# Patient Record
Sex: Female | Born: 1990 | Race: White | Hispanic: No | Marital: Single | Smoking: Never smoker
Health system: Southern US, Community
[De-identification: ages and names within clinical notes are randomized; demographics above are authoritative.]

## PROBLEM LIST (undated history)

## (undated) DIAGNOSIS — D649 Anemia, unspecified: Secondary | ICD-10-CM

## (undated) HISTORY — PX: TONSILLECTOMY: SUR1361

## (undated) HISTORY — PX: KIDNEY SURGERY: SHX687

---
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
## 2013-10-21 ENCOUNTER — Emergency Department (INDEPENDENT_AMBULATORY_CARE_PROVIDER_SITE_OTHER)
Admission: EM | Admit: 2013-10-21 | Discharge: 2013-10-21 | Disposition: A | Discharge status: Home / Self Care | Payer: Medicaid Other | Source: Home / Self Care | Attending: Emergency Medicine | Admitting: Emergency Medicine

## 2013-10-21 ENCOUNTER — Encounter (HOSPITAL_COMMUNITY): Payer: Self-pay | Admitting: Emergency Medicine

## 2013-10-21 ENCOUNTER — Emergency Department (HOSPITAL_COMMUNITY)
Admission: EM | Admit: 2013-10-21 | Discharge: 2013-10-21 | Discharge status: Absent without leave | Payer: Medicaid Other | Source: Home / Self Care

## 2013-10-21 ENCOUNTER — Emergency Department (INDEPENDENT_AMBULATORY_CARE_PROVIDER_SITE_OTHER): Payer: Medicaid Other

## 2013-10-21 DIAGNOSIS — J209 Acute bronchitis, unspecified: Secondary | ICD-10-CM

## 2013-10-21 HISTORY — DX: Anemia, unspecified: D64.9

## 2013-10-21 MED ORDER — TRAMADOL HCL 50 MG PO TABS: ORAL_TABLET | ORAL | Status: DC

## 2013-10-21 MED ORDER — ALBUTEROL SULFATE HFA 108 (90 BASE) MCG/ACT IN AERS: 2.0000 | INHALATION_SPRAY | Freq: Four times a day (QID) | RESPIRATORY_TRACT | Status: AC

## 2013-10-21 MED ORDER — AZITHROMYCIN 250 MG PO TABS: ORAL_TABLET | ORAL | Status: DC

## 2013-10-21 NOTE — ED Notes (Signed)
Pt c/o dry cough, congestion, HAs, bodyaches and chills x 2-3 days. Pt reports she is anemic but still feels very weak. Took 800 mg of Ibuprofen with no relief. Pt denies fever, n/v/d. Pt is alert and oriented and in no acute distress.

## 2013-10-21 NOTE — Discharge Instructions (Signed)
How to Use an Inhaler °Proper inhaler technique is very important. Good technique ensures that the medicine reaches the lungs. Poor technique results in depositing the medicine on the tongue and back of the throat rather than in the airways. If you do not use the inhaler with good technique, the medicine will not help you. °STEPS TO FOLLOW IF USING AN INHALER WITHOUT AN EXTENSION TUBE °1. Remove the cap from the inhaler. °2. If you are using the inhaler for the first time, you will need to prime it. Shake the inhaler for 5 seconds and release four puffs into the air, away from your face. Ask your health care provider or pharmacist if you have questions about priming your inhaler. °3. Shake the inhaler for 5 seconds before each breath in (inhalation). °4. Position the inhaler so that the top of the canister faces up. °5. Put your index finger on the top of the medicine canister. Your thumb supports the bottom of the inhaler. °6. Open your mouth. °7. Either place the inhaler between your teeth and place your lips tightly around the mouthpiece, or hold the inhaler 1 2 inches away from your open mouth. If you are unsure of which technique to use, ask your health care provider. °8. Breathe out (exhale) normally and as completely as possible. °9. Press the canister down with your index finger to release the medicine. °10. At the same time as the canister is pressed, inhale deeply and slowly until your lungs are completely filled. This should take 4 6 seconds. Keep your tongue down. °11. Hold the medicine in your lungs for 5 10 seconds (10 seconds is best). This helps the medicine get into the small airways of your lungs. °12. Breathe out slowly, through pursed lips. Whistling is an example of pursed lips. °13. Wait at least 15 30 seconds between puffs. Continue with the above steps until you have taken the number of puffs your health care provider has ordered. Do not use the inhaler more than your health care provider  tells you. °14. Replace the cap on the inhaler. °15. Follow the directions from your health care provider or the inhaler insert for cleaning the inhaler. °STEPS TO FOLLOW IF USING AN INHALER WITH AN EXTENSION (SPACER) °1. Remove the cap from the inhaler. °2. If you are using the inhaler for the first time, you will need to prime it. Shake the inhaler for 5 seconds and release four puffs into the air, away from your face. Ask your health care provider or pharmacist if you have questions about priming your inhaler. °3. Shake the inhaler for 5 seconds before each breath in (inhalation). °4. Place the open end of the spacer onto the mouthpiece of the inhaler. °5. Position the inhaler so that the top of the canister faces up and the spacer mouthpiece faces you. °6. Put your index finger on the top of the medicine canister. Your thumb supports the bottom of the inhaler and the spacer. °7. Breathe out (exhale) normally and as completely as possible. °8. Immediately after exhaling, place the spacer between your teeth and into your mouth. Close your lips tightly around the spacer. °9. Press the canister down with your index finger to release the medicine. °10. At the same time as the canister is pressed, inhale deeply and slowly until your lungs are completely filled. This should take 4 6 seconds. Keep your tongue down and out of the way. °11. Hold the medicine in your lungs for 5 10 seconds (10   seconds is best). This helps the medicine get into the small airways of your lungs. Exhale. °12. Repeat inhaling deeply through the spacer mouthpiece. Again hold that breath for up to 10 seconds (10 seconds is best). Exhale slowly. If it is difficult to take this second deep breath through the spacer, breathe normally several times through the spacer. Remove the spacer from your mouth. °13. Wait at least 15 30 seconds between puffs. Continue with the above steps until you have taken the number of puffs your health care provider has  ordered. Do not use the inhaler more than your health care provider tells you. °14. Remove the spacer from the inhaler, and place the cap on the inhaler. °15. Follow the directions from your health care provider or the inhaler insert for cleaning the inhaler and spacer. °If you are using different kinds of inhalers, use your quick relief medicine to open the airways 10 15 minutes before using a steroid if instructed to do so by your health care provider. If you are unsure which inhalers to use and the order of using them, ask your health care provider, nurse, or respiratory therapist. °If you are using a steroid inhaler, always rinse your mouth with water after your last puff, then gargle and spit out the water. Do not swallow the water. °AVOID: °· Inhaling before or after starting the spray of medicine. It takes practice to coordinate your breathing with triggering the spray. °· Inhaling through the nose (rather than the mouth) when triggering the spray. °HOW TO DETERMINE IF YOUR INHALER IS FULL OR NEARLY EMPTY °You cannot know when an inhaler is empty by shaking it. A few inhalers are now being made with dose counters. Ask your health care provider for a prescription that has a dose counter if you feel you need that extra help. If your inhaler does not have a counter, ask your health care provider to help you determine the date you need to refill your inhaler. Write the refill date on a calendar or your inhaler canister. Refill your inhaler 7 10 days before it runs out. Be sure to keep an adequate supply of medicine. This includes making sure it is not expired, and that you have a spare inhaler.  °SEEK MEDICAL CARE IF:  °· Your symptoms are only partially relieved with your inhaler. °· You are having trouble using your inhaler. °· You have some increase in phlegm. °SEEK IMMEDIATE MEDICAL CARE IF:  °· You feel little or no relief with your inhalers. You are still wheezing and are feeling shortness of breath or  tightness in your chest or both. °· You have dizziness, headaches, or a fast heart rate. °· You have chills, fever, or night sweats. °· You have a noticeable increase in phlegm production, or there is blood in the phlegm. °MAKE SURE YOU:  °· Understand these instructions. °· Will watch your condition. °· Will get help right away if you are not doing well or get worse. °Document Released: 07/02/2000 Document Revised: 04/25/2013 Document Reviewed: 02/01/2013 °[employer]® Patient Information ©2014 [employer]. ° °Most upper respiratory infections are caused by viruses and do not require antibiotics.  We try to save the antibiotics for when we really need them to prevent bacteria from developing resistance to them.  Here are a few hints about things that can be done at home to help get over an upper respiratory infection quicker: ° °Get extra sleep and extra fluids.  Get 7 to 9 hours of sleep per   night and 6 to 8 glasses of water a day.  Getting extra sleep keeps the immune system from getting run down.  Most people with an upper respiratory infection are a little dehydrated.  The extra fluids also keep the secretions liquified and easier to deal with.  Also, get extra vitamin C.  4000 mg per day is the recommended dose. °For the aches, headache, and fever, acetaminophen or ibuprofen are helpful.  These can be alternated every 4 hours.  People with liver disease should avoid large amounts of acetaminophen, and people with ulcer disease, gastroesophageal reflux, gastritis, congestive heart failure, chronic kidney disease, coronary artery disease and the elderly should avoid ibuprofen. °For nasal congestion try Mucinex-D, or if you're having lots of sneezing or clear nasal drainage use Zyrtec-D. People with high blood pressure can take these if their blood pressure is controlled, if not, it's best to avoid the forms with a "D" (decongestants).  You can use the plain Mucinex, Allegra, Claritin, or Zyrtec even if your blood  pressure is not controlled.   °A Saline nasal spray such as Ocean Spray can also help.  You can add a decongestant sprays such as Afrin, but you should not use the decongestant sprays for more than 3 or 4 days since they can be habituating.  Breathe Rite nasal strips can also offer a non-drug alternative treatment to nasal congestion, especially at night. °For people with symptoms of sinusitis, sleeping with your head elevated can be helpful.  For sinus pain, moist, hot compresses to the face may provide some relief.  Many people find that inhaling steam as in a shower or from a pot of steaming water can help. °For any viral infection, zinc containing lozenges such as Cold-Eze or Zicam are helpful.  Zinc helps to fight viral infection.  Hot salt water gargles (8 oz of hot water, 1/2 tsp of table salt, and a pinch of baking soda) can give relief as well as hot beverages such as hot tea.  Sucrets extra strength lozenges will help the sore throat.  °For the cough, take Delsym 2 tsp every 12 hours.  It has also been found recently that Aleve can help control a cough.  The dose is 1 to 2 tablets twice daily with food.  This can be combined with Delsym. (Note, if you are taking ibuprofen, you should not take Aleve as well--take one or the other.) °A cool mist vaporizer will help keep your mucous membranes from drying out.  ° °It's important when you have an upper respiratory infection not to pass the infection to others.  This involves being very careful about the following: ° °Frequent hand washing or use of hand sanitizer, especially after coughing, sneezing, blowing your nose or touching your face, nose or eyes. °Do not shake hands or touch anyone and try to avoid touching surfaces that other people use such as doorknobs, shopping carts, telephones and computer keyboards. °Use tissues and dispose of them properly in a garbage can or ziplock bag. °Cough into your sleeve. °Do not let others eat or drink after  you. ° °It's also important to recognize the signs of serious illness and get evaluated if they occur: °Any respiratory infection that lasts more than 7 to 10 days.  Yellow nasal drainage and sputum are not reliable indicators of a bacterial infection, but if they last for more than 1 week, see your doctor. °Fever and sore throat can indicate strep. °Fever and cough can indicate influenza or pneumonia. °  Any kind of severe symptom such as difficulty breathing, intractable vomiting, or severe pain should prompt you to see a doctor as soon as possible. ° ° °Your body's immune system is really the thing that will get rid of this infection.  Your immune system is comprised of 2 types of specialized cells called T cells and B cells.  T cells coordinate the array of cells in your body that engulf invading bacteria or viruses while B cells orchestrate the production of antibodies that neutralize infection.  Anything we do or any medications we give you, will just strengthen your immune system or help it clear up the infection quicker.  Here are a few helpful hints to improve your immune system to help overcome this illness or to prevent future infections: °· A few vitamins can improve the health of your immune system.  That's why your diet should include plenty of fruits, vegetables, fish, nuts, and whole grains. °· Vitamin A and bet-carotene can increase the cells that fight infections (T cells and B cells).  Vitamin A is abundant in dark greens and orange vegetables such as spinach, greens, sweet potatoes, and carrots. °· Vitamin B6 contributes to the maturation of white blood cells, the cells that fight disease.  Foods with vitamin B6 include cold cereal and bananas. °· Vitamin C is credited with preventing colds because it increases white blood cells and also prevents cellular damage.  Citrus fruits, peaches and green and red bell peppers are all hight in vitamin C. °· Vitamin E is an anti-oxidant that encourages the  production of natural killer cells which reject foreign invaders and B cells that produce antibodies.  Foods high in vitamin E include wheat germ, nuts and seeds. °· Foods high in omega-3 fatty acids found in foods like salmon, tuna and mackerel boost your immune system and help cells to engulf and absorb germs. °· Probiotics are good bacteria that increase your T cells.  These can be found in yogurt and are available in supplements such as Culturelle or Align. °· Moderate exercise increases the strength of your immune system and your ability to recover from illness.  I suggest 3 to 5 moderate intensity 30 minute workouts per week.   °· Sleep is another component of maintaining a strong immune system.  It enables your body to recuperate from the day's activities, stress and work.  My recommendation is to get between 7 and 9 hours of sleep per night. °· If you smoke, try to quit completely or at least cut down.  Drink alcohol only in moderation if at all.  No more than 2 drinks daily for men or 1 for women. °· Get a flu vaccine early in the fall or if you have not gotten one yet, once this illness has run its course.  If you are over 65, a smoker, or an asthmatic, get a pneumococcal vaccine. °· My final recommendation is to maintain a healthy weight.  Excess weight can impair the immune system by interfering with the way the immune system deals with invading viruses or bacteria. ° ° ° °

## 2013-10-21 NOTE — ED Provider Notes (Signed)
Chief Complaint   Chief Complaint  Patient presents with  . URI    History of Present Illness   Barbara Hobbs is a 23 year old female who comes in today with 2 sick children. She has had a three-day history of generalized myalgias, chills, felt hot and cold, she's had a cough productive green sputum, chest tightness, aching in her chest. She also has had headache, nasal congestion, ear ache, and sore throat.  Review of Systems   Other than as noted above, the patient denies any of the following symptoms: Systemic:  No fevers, chills, sweats, or myalgias. Eye:  No redness or discharge. ENT:  No ear pain, headache, nasal congestion, drainage, sinus pressure, or sore throat. Neck:  No neck pain, stiffness, or swollen glands. Lungs:  No cough, sputum production, hemoptysis, wheezing, chest tightness, shortness of breath or chest pain. GI:  No abdominal pain, nausea, vomiting or diarrhea.  PMFSH   Past medical history, family history, social history, meds, and allergies were reviewed.   Physical exam   Vital signs:  BP 104/62  Pulse 87  Temp(Src) 100.1 F (37.8 C) (Oral)  Resp 18  SpO2 100%  LMP 09/27/2013 General:  Alert and oriented.  In no distress.  Skin warm and dry. Eye:  No conjunctival injection or drainage. Lids were normal. ENT:  TMs and canals were normal, without erythema or inflammation.  Nasal mucosa was clear and uncongested, without drainage.  Mucous membranes were moist.  Pharynx was clear with no exudate or drainage.  There were no oral ulcerations or lesions. Neck:  Supple, no adenopathy, tenderness or mass. Lungs:  No respiratory distress.  There were rales and wheezes at the left base, no rhonchi. She has good air movement bilaterally. Heart:  Regular rhythm, without gallops, murmers or rubs. Skin:  Clear, warm, and dry, without rash or lesions.  Radiology   Dg Chest 2 View  10/21/2013   CLINICAL DATA:  Cough, fever  EXAM: CHEST  2 VIEW  COMPARISON:   None.  FINDINGS: Cardiomediastinal silhouette is unremarkable. No acute infiltrate or pleural effusion. No pulmonary edema. Bony thorax is unremarkable.  IMPRESSION: No active cardiopulmonary disease.   Electronically Signed   By: Natasha Mead M.D.   On: 10/21/2013 18:32   Assessment     The encounter diagnosis was Acute bronchitis.  Plan    1.  Meds:  The following meds were prescribed:   Discharge Medication List as of 10/21/2013  6:46 PM    START taking these medications   Details  albuterol (PROVENTIL HFA;VENTOLIN HFA) 108 (90 BASE) MCG/ACT inhaler Inhale 2 puffs into the lungs 4 (four) times daily., Starting 10/21/2013, Until Discontinued, Normal    azithromycin (ZITHROMAX Z-PAK) 250 MG tablet Take as directed., Normal    traMADol (ULTRAM) 50 MG tablet 1 to 2 tabs every 8 hours as needed for cough., Normal        2.  Patient Education/Counseling:  The patient was given appropriate handouts, self care instructions, and instructed in symptomatic relief.  Instructed to get extra fluids, rest, and use a cool mist vaporizer.    3.  Follow up:  The patient was told to follow up here if no better in 3 to 4 days, or sooner if becoming worse in any way, and given some red flag symptoms such as increasing fever, difficulty breathing, chest pain, or persistent vomiting which would prompt immediate return.  Follow up here as needed.      Reuben Likes,  MD 10/21/13 2039

## 2013-10-24 NOTE — ED Notes (Signed)
Pt  Phoned  Stating that  She  Thinks  The   z  Max  Is  Making he         Sick  She  States  She  Vomited        After  Taking  It     Dr Lorenz Coasterkeller notified  [facility]  500  Mg   Tid  Number  30  To  Be  Phoned  In to  [facility]  On [place] /  Francesco RunnerHolden

## 2017-12-05 ENCOUNTER — Emergency Department (HOSPITAL_COMMUNITY): Payer: Self-pay

## 2017-12-05 ENCOUNTER — Emergency Department (HOSPITAL_COMMUNITY)
Admission: EM | Admit: 2017-12-05 | Discharge: 2017-12-05 | Disposition: A | Discharge status: Home / Self Care | Payer: Self-pay | Attending: Emergency Medicine | Admitting: Emergency Medicine

## 2017-12-05 ENCOUNTER — Encounter (HOSPITAL_COMMUNITY): Payer: Self-pay | Admitting: Emergency Medicine

## 2017-12-05 ENCOUNTER — Encounter (HOSPITAL_COMMUNITY): Payer: Self-pay

## 2017-12-05 ENCOUNTER — Other Ambulatory Visit: Payer: Self-pay

## 2017-12-05 DIAGNOSIS — R072 Precordial pain: Secondary | ICD-10-CM | POA: Insufficient documentation

## 2017-12-05 LAB — BASIC METABOLIC PANEL
ANION GAP: 10 (ref 5–15)
BUN: 13 mg/dL (ref 6–20)
CHLORIDE: 106 mmol/L (ref 101–111)
CO2: 22 mmol/L (ref 22–32)
Calcium: 8.5 mg/dL — ABNORMAL LOW (ref 8.9–10.3)
Creatinine, Ser: 0.7 mg/dL (ref 0.44–1.00)
GFR calc non Af Amer: >60 mL/min (ref >60)
Glucose, Bld: 116 mg/dL — ABNORMAL HIGH (ref 65–99)
POTASSIUM: 3.1 mmol/L — AB (ref 3.5–5.1)
SODIUM: 138 mmol/L (ref 135–145)

## 2017-12-05 LAB — I-STAT TROPONIN, ED: Troponin i, poc: 0 ng/mL (ref 0.00–0.08)

## 2017-12-05 LAB — CBC
HEMATOCRIT: 39.6 % (ref 36.0–46.0)
Hemoglobin: 13.5 g/dL (ref 12.0–15.0)
MCH: 31.3 pg (ref 26.0–34.0)
MCHC: 34.1 g/dL (ref 30.0–36.0)
MCV: 91.7 fL (ref 78.0–100.0)
Platelets: 186 10*3/uL (ref 150–400)
RBC: 4.32 MIL/uL (ref 3.87–5.11)
RDW: 11.8 % (ref 11.5–15.5)
WBC: 3.8 10*3/uL — AB (ref 4.0–10.5)

## 2017-12-05 LAB — I-STAT BETA HCG BLOOD, ED (MC, WL, AP ONLY): I-stat hCG, quantitative: <5 m[IU]/mL (ref <5)

## 2017-12-05 LAB — D-DIMER, QUANTITATIVE (NOT AT ARMC)

## 2017-12-05 MED ORDER — IBUPROFEN 800 MG PO TABS
800.0000 mg | ORAL_TABLET | Freq: Once | ORAL | Status: AC
  Administered 2017-12-05: 800 mg via ORAL
  Filled 2017-12-05: qty 1

## 2017-12-05 MED ORDER — IBUPROFEN 800 MG PO TABS: 800.0000 mg | ORAL_TABLET | Freq: Three times a day (TID) | ORAL | 0 refills | Status: DC | PRN

## 2017-12-05 NOTE — ED Triage Notes (Signed)
Pt states that L sided CP has been going on for a couple weeks, worse tonight along with SOB, denies n/v, worse with palpation and movement. Reports heavy lifting two weeks ago

## 2017-12-05 NOTE — ED Provider Notes (Signed)
Emergency Department Provider Note   I have reviewed the triage vital signs and the nursing notes.   HISTORY  Chief Complaint Chest Pain   HPI Barbara Hobbs is a 27 y.o. female with PMH of OCP use presents to the emergency department for evaluation of intermittent chest pain over the past 3 weeks.  Patient states that initially her pain felt like a "pulling" in the left chest and started after lifting.  Her pain has become more sharp, left-sided, nonradiating, and worse with deep breathing over the past 36 hours which the patient concerned.  She denies any dyspnea or near syncope.  No hemoptysis.  No history of chest wall injury.  No prior history of PE/DVT.  Denies any leg pain or swelling.   Past Medical History:  Diagnosis Date  . Anemia     There are no active problems to display for this patient.   History reviewed. No pertinent surgical history.    Allergies Penicillins  No family history on file.  Social History Social History   Tobacco Use  . Smoking status: Never Smoker  . Smokeless tobacco: Never Used  Substance Use Topics  . Alcohol use: Never    Frequency: Never  . Drug use: Never    Review of Systems  Constitutional: No fever/chills Eyes: No visual changes. ENT: No sore throat. Cardiovascular: Positive chest pain. Respiratory: Denies shortness of breath. Gastrointestinal: No abdominal pain.  No nausea, no vomiting.  No diarrhea.  No constipation. Genitourinary: Negative for dysuria. Musculoskeletal: Negative for back pain. Skin: Negative for rash. Neurological: Negative for headaches, focal weakness or numbness.  10-point ROS otherwise negative.  ____________________________________________   PHYSICAL EXAM:  VITAL SIGNS: ED Triage Vitals [12/05/17 0100]  Enc Vitals Group     BP 117/70     Pulse Rate 71     Resp 20     Temp 97.8 F (36.6 C)     Temp Source Oral     SpO2 100 %     Pain Score 6   Constitutional: Alert and  oriented. Well appearing and in no acute distress. Eyes: Conjunctivae are normal. Head: Atraumatic. Nose: No congestion/rhinnorhea. Mouth/Throat: Mucous membranes are moist.  Oropharynx non-erythematous. Neck: No stridor.   Cardiovascular: Normal rate, regular rhythm. Good peripheral circulation. Grossly normal heart sounds.   Respiratory: Normal respiratory effort.  No retractions. Lungs CTAB. Gastrointestinal: Soft and nontender. No distention.  Musculoskeletal: No lower extremity tenderness nor edema. No gross deformities of extremities. Neurologic:  Normal speech and language. No gross focal neurologic deficits are appreciated.  Skin:  Skin is warm, dry and intact. No rash noted.  ____________________________________________   LABS (all labs ordered are listed, but only abnormal results are displayed)  Labs Reviewed  BASIC METABOLIC PANEL - Abnormal; Notable for the following components:      Result Value   Potassium 3.1 (*)    Glucose, Bld 116 (*)    Calcium 8.5 (*)    All other components within normal limits  CBC - Abnormal; Notable for the following components:   WBC 3.8 (*)    All other components within normal limits  D-DIMER, QUANTITATIVE (NOT AT [facility])  I-STAT TROPONIN, ED  I-STAT BETA HCG BLOOD, ED ([facility], [facility], [facility] ONLY)   ____________________________________________  EKG   EKG Interpretation  Date/Time:  Monday Dec 05 2017 00:54:20 EDT Ventricular Rate:  72 PR Interval:  126 QRS Duration: 96 QT Interval:  398 QTC Calculation: 435 R Axis:   110  Text Interpretation:  Normal sinus rhythm Right axis deviation Incomplete right bundle branch block Anterior infarct , age undetermined Abnormal ECG No previous ECGs available Confirmed by Zadie Rhine ([id number]) on 12/05/2017 1:02:21 AM Also confirmed by Zadie Rhine ([id number]), editor Elita Quick ([id number])  on 12/05/2017 6:56:23 AM       ____________________________________________  RADIOLOGY  Dg Chest 2  View  Result Date: 12/05/2017 CLINICAL DATA:  Left-sided chest pain for 2 weeks EXAM: CHEST - 2 VIEW COMPARISON:  None. FINDINGS: The heart size and mediastinal contours are within normal limits. Both lungs are clear. The visualized skeletal structures are unremarkable. IMPRESSION: No active cardiopulmonary disease. Electronically Signed   By: Jasmine Pang M.D.   On: 12/05/2017 02:27    ____________________________________________   PROCEDURES  Procedure(s) performed:   Procedures  None ____________________________________________   INITIAL IMPRESSION / ASSESSMENT AND PLAN / ED COURSE  Pertinent labs & imaging results that were available during my care of the patient were reviewed by me and considered in my medical decision making (see chart for details).  Patient presents to the emergency department with 3 weeks of intermittent chest pain.  Pain quality is changed in the last 36 hours and is now more sharp and pleuritic.  Patient has no tachycardia or hypoxemia.  My suspicion for PE remains low but patient does have elevated risk of being on OCPs.  I have added on d-dimer.  Labs reviewed from triage including troponin which are normal.  Pregnancy test negative.  Chest x-ray negative.   Differential includes all life-threatening causes for chest pain. This includes but is not exclusive to acute coronary syndrome, aortic dissection, pulmonary embolism, cardiac tamponade, community-acquired pneumonia, pericarditis, musculoskeletal chest wall pain, etc.  Labs and imaging reviewed. D-dimer negative. Plan to treat supportively at home and advised continued PCP follow up.   At this time, I do not feel there is any life-threatening condition present. I have reviewed and discussed all results (EKG, imaging, lab, urine as appropriate), exam findings with patient. I have reviewed nursing notes and appropriate previous records.  I feel the patient is safe to be discharged home without further  emergent workup. Discussed usual and customary return precautions. Patient and family (if present) verbalize understanding and are comfortable with this plan.  Patient will follow-up with their primary care provider. If they do not have a primary care provider, information for follow-up has been provided to them. All questions have been answered.  ____________________________________________  FINAL CLINICAL IMPRESSION(S) / ED DIAGNOSES  Final diagnoses:  Precordial chest pain     MEDICATIONS GIVEN DURING THIS VISIT:  Medications  ibuprofen (ADVIL,MOTRIN) tablet 800 mg (800 mg Oral Given 12/05/17 0937)     NEW OUTPATIENT MEDICATIONS STARTED DURING THIS VISIT:  Discharge Medication List as of 12/05/2017 10:53 AM    START taking these medications   Details  ibuprofen (ADVIL,MOTRIN) 800 MG tablet Take 1 tablet (800 mg total) by mouth every 8 (eight) hours as needed., Starting Mon 12/05/2017, Print        Note:  This document was prepared using Dragon voice recognition software and may include unintentional dictation errors.  Alona Bene, MD Emergency Medicine    Long, Arlyss Repress, MD 12/05/17 [place]

## 2017-12-05 NOTE — Discharge Instructions (Signed)
You were seen in the ED today with chest pain. Your labs and imaging are normal. I think this is most likely a muscle strain in the chest but if you experience sudden worsening symptoms I would want you to return to the ED for further evaluation.

## 2017-12-05 NOTE — ED Notes (Signed)
D/c reviewed with patient and family 

## 2017-12-05 NOTE — ED Notes (Signed)
One attempt to draw blood.

## 2017-12-05 NOTE — ED Notes (Signed)
Patient updated on wait time, this RN apologized for patient's wait, pt verbalized understanding, ambulatory in waiting room, nad.

## 2018-01-12 ENCOUNTER — Other Ambulatory Visit: Payer: Self-pay

## 2018-01-13 ENCOUNTER — Encounter (HOSPITAL_COMMUNITY): Payer: Self-pay | Admitting: Emergency Medicine

## 2018-01-13 ENCOUNTER — Emergency Department (HOSPITAL_COMMUNITY): Payer: Self-pay

## 2018-01-13 ENCOUNTER — Emergency Department (HOSPITAL_COMMUNITY)
Admission: EM | Admit: 2018-01-13 | Discharge: 2018-01-13 | Disposition: A | Discharge status: Home / Self Care | Payer: Self-pay | Attending: Emergency Medicine | Admitting: Emergency Medicine

## 2018-01-13 DIAGNOSIS — R1310 Dysphagia, unspecified: Secondary | ICD-10-CM | POA: Insufficient documentation

## 2018-01-13 DIAGNOSIS — R0789 Other chest pain: Secondary | ICD-10-CM | POA: Insufficient documentation

## 2018-01-13 LAB — I-STAT BETA HCG BLOOD, ED (MC, WL, AP ONLY)

## 2018-01-13 LAB — I-STAT TROPONIN, ED: Troponin i, poc: 0 ng/mL (ref 0.00–0.08)

## 2018-01-13 LAB — CBC
HEMATOCRIT: 45.8 % (ref 36.0–46.0)
HEMOGLOBIN: 14.9 g/dL (ref 12.0–15.0)
MCH: 31.1 pg (ref 26.0–34.0)
MCHC: 32.5 g/dL (ref 30.0–36.0)
MCV: 95.6 fL (ref 78.0–100.0)
Platelets: 273 10*3/uL (ref 150–400)
RBC: 4.79 MIL/uL (ref 3.87–5.11)
RDW: 12 % (ref 11.5–15.5)
WBC: 5.4 10*3/uL (ref 4.0–10.5)

## 2018-01-13 MED ORDER — CYCLOBENZAPRINE HCL 10 MG PO TABS: 10.0000 mg | ORAL_TABLET | Freq: Two times a day (BID) | ORAL | 0 refills | Status: DC | PRN

## 2018-01-13 MED ORDER — IOPAMIDOL (ISOVUE-370) INJECTION 76%
100.0000 mL | Freq: Once | INTRAVENOUS | Status: AC | PRN
  Administered 2018-01-13: 100 mL via INTRAVENOUS

## 2018-01-13 MED ORDER — IOPAMIDOL (ISOVUE-370) INJECTION 76%
INTRAVENOUS | Status: AC
  Filled 2018-01-13: qty 100

## 2018-01-13 NOTE — ED Notes (Signed)
Patient transported to CT 

## 2018-01-13 NOTE — ED Provider Notes (Signed)
[facility]   CSN: [id number] Arrival date & time: 01/13/18  1009     History   Chief Complaint Chief Complaint  Patient presents with  . Chest Pain    HPI Barbara Hobbs is a 27 y.o. female.  Pt presents to the ED today with left sided cp.  The pt has had this pain for several weeks.  She initially came to the ED on 5/20 for the same.  Eval was negative, so she was sent home.  The pt's pain does not happen all the time, it comes and goes.  She said a ball forms in her left chest and spasms.  She is worried she has a blood clot, although her ddimer on 5/20 was negative and sx sound msk in origin.  No leg pain.  She does take BCPs.  She also c/o sore throat and swollen lymph nodes.  She said she will occ have to push her lymph nodes to the side to swallow.  No f/c.  This has been going on for months.     Past Medical History:  Diagnosis Date  . Anemia     There are no active problems to display for this patient.   History reviewed. No pertinent surgical history.   OB History   None      Home Medications    Prior to Admission medications   Medication Sig Start Date End Date Taking? Authorizing Provider  JUNEL FE 1/20 1-20 MG-MCG tablet Take 1 tablet by mouth daily. 09/23/17  Yes [provider]  Multiple Vitamin (MULTIVITAMIN) capsule Take 1 capsule by mouth daily. Gummies   Yes [provider]  albuterol (PROVENTIL HFA;VENTOLIN HFA) 108 (90 BASE) MCG/ACT inhaler Inhale 2 puffs into the lungs 4 (four) times daily. Patient not taking: Reported on 01/13/2018 10/21/13   Reuben LikesKeller, David C, MD  azithromycin (ZITHROMAX Z-PAK) 250 MG tablet Take as directed. Patient not taking: Reported on 01/13/2018 10/21/13   Reuben LikesKeller, David C, MD  cyclobenzaprine (FLEXERIL) 10 MG tablet Take 1 tablet (10 mg total) by mouth 2 (two) times daily as needed for muscle spasms. 01/13/18   Jacalyn LefevreHaviland, Luticia Tadros, MD  ibuprofen (ADVIL,MOTRIN) 800  MG tablet Take 1 tablet (800 mg total) by mouth every 8 (eight) hours as needed. Patient not taking: Reported on 01/13/2018 12/05/17   Long, Arlyss RepressJoshua G, MD  traMADol (ULTRAM) 50 MG tablet 1 to 2 tabs every 8 hours as needed for cough. Patient not taking: Reported on 01/13/2018 10/21/13   Reuben LikesKeller, David C, MD    Family History History reviewed. No pertinent family history.  Social History Social History   Tobacco Use  . Smoking status: Never Smoker  . Smokeless tobacco: Never Used  Substance Use Topics  . Alcohol use: Never    Frequency: Never  . Drug use: Never     Allergies   Penicillins   Review of Systems Review of Systems  HENT: Positive for trouble swallowing.   Cardiovascular: Positive for chest pain.  All other systems reviewed and are negative.    Physical Exam Updated Vital Signs BP 114/74   Pulse 68   Temp (!) 97.4 F (36.3 C) (Oral)   Resp 13   SpO2 100%   Physical Exam  Constitutional: She is oriented to person, place, and time. She appears well-developed and well-nourished.  HENT:  Head: Normocephalic and atraumatic.  Eyes: Pupils are equal, round, and reactive to light. EOM are normal.  Neck: Normal  range of motion. Neck supple.  Cardiovascular: Normal rate, regular rhythm and normal pulses.  Pulmonary/Chest: Effort normal and breath sounds normal.    Musculoskeletal: Normal range of motion.       Right lower leg: Normal.       Left lower leg: Normal.  Lymphadenopathy:    She has no cervical adenopathy.  Neurological: She is alert and oriented to person, place, and time.  Skin: Skin is warm. Capillary refill takes less than 2 seconds.  Psychiatric: She has a normal mood and affect. Her behavior is normal.  Nursing note and vitals reviewed.    ED Treatments / Results  Labs (all labs ordered are listed, but only abnormal results are displayed) Labs Reviewed  CBC  I-STAT TROPONIN, ED  I-STAT BETA HCG BLOOD, ED ([facility], [facility], [facility] ONLY)    EKG EKG  Interpretation  Date/Time:  Thursday January 12 2018 10:22:57 EDT Ventricular Rate:  62 PR Interval:  122 QRS Duration: 96 QT Interval:  392 QTC Calculation: 397 R Axis:   91 Text Interpretation:  Normal sinus rhythm Rightward axis Incomplete right bundle branch block Borderline ECG No significant change since last tracing Confirmed by Jacalyn Lefevre [phone]) on 01/13/2018 12:08:44 PM   Radiology Ct Soft Tissue Neck W Contrast  Result Date: 01/13/2018 CLINICAL DATA:  Initial evaluation for swollen glands, sore throat for 1 month. EXAM: CT NECK WITH CONTRAST TECHNIQUE: Multidetector CT imaging of the neck was performed using the standard protocol following the bolus administration of intravenous contrast. CONTRAST:  ISOVUE-370 IOPAMIDOL (ISOVUE-370) INJECTION 76% COMPARISON:  None available. FINDINGS: Pharynx and larynx: Oral cavity within normal limits without mass lesion or loculated fluid collection. No acute abnormality about the dentition. No base of tongue lesion. Oropharynx within normal limits. Mild asymmetric fullness at the right nasopharynx without discrete mass. Parapharyngeal fat maintained. No retropharyngeal collection. Epiglottis normal. Vallecula clear. Remainder of the hypopharynx and supraglottic larynx within normal limits. True cords symmetric and normal. Subglottic airway clear. Salivary glands: Salivary glands including the parotid and submandibular glands are within normal limits. Thyroid: Thyroid normal. Lymph nodes: No pathologically enlarged lymph nodes identified within the neck. Vascular: Normal intravascular enhancement seen throughout the neck. Limited intracranial: Unremarkable. Visualized orbits: Globes and orbital soft tissues within normal limits. Mastoids and visualized paranasal sinuses: Visualized paranasal sinuses are clear. Mastoid air cells and middle ear cavities are well pneumatized and free of fluid. Skeleton: No acute osseous abnormality. No discrete lytic  or blastic osseous lesions. Upper chest: Visualized upper chest within normal limits. Partially visualized lungs are clear. Other: None. IMPRESSION: Normal CT of the neck. No acute inflammatory changes or other abnormality identified. Electronically Signed   By: Rise Mu M.D.   On: 01/13/2018 13:51   Ct Angio Chest Pe W/cm &/or Wo Cm  Result Date: 01/13/2018 CLINICAL DATA:  Left-sided chest pain for 1 month EXAM: CT ANGIOGRAPHY CHEST WITH CONTRAST TECHNIQUE: Multidetector CT imaging of the chest was performed using the standard protocol during bolus administration of intravenous contrast. Multiplanar CT image reconstructions and MIPs were obtained to evaluate the vascular anatomy. CONTRAST:  ISOVUE-370 IOPAMIDOL (ISOVUE-370) INJECTION 76% COMPARISON:  None. FINDINGS: Cardiovascular: Thoracic aorta is incompletely opacified although within normal limits without atherosclerotic change or aneurysmal dilatation. No cardiac enlargement is seen. The pulmonary artery shows a normal branching pattern without intraluminal filling defect to suggest pulmonary embolism. Mediastinum/Nodes: Thoracic inlet is within normal limits. No significant hilar or mediastinal adenopathy is noted. The esophagus is within  normal limits. Lungs/Pleura: The lungs are well aerated bilaterally. No focal infiltrate or sizable effusion is seen. No pneumothorax is noted. A vague 4 mm subpleural nodule is noted in the left upper lobe posteriorly best seen on image number 34 of series 4. No other significant nodules are seen. Upper Abdomen: Visualized upper abdomen is unremarkable. Musculoskeletal: No chest wall abnormality. No acute or significant osseous findings. Review of the MIP images confirms the above findings. IMPRESSION: No evidence of pulmonary emboli. 4 mm subpleural nodule in the left upper lobe as described. No follow-up needed if patient is low-risk. Non-contrast chest CT can be considered in 12 months if patient is  high-risk. This recommendation follows the consensus statement: Guidelines for Management of Incidental Pulmonary Nodules Detected on CT Images: From the Fleischner Society 2017; Radiology 2017; 284:228-243. Electronically Signed   By: Alcide Clever M.D.   On: 01/13/2018 13:35    Procedures Procedures (including critical care time)  Medications Ordered in ED Medications  iopamidol (ISOVUE-370) 76 % injection (has no administration in time range)  iopamidol (ISOVUE-370) 76 % injection 100 mL (100 mLs Intravenous Contrast Given 01/13/18 1305)     Initial Impression / Assessment and Plan / ED Course  I have reviewed the triage vital signs and the nursing notes.  Pertinent labs & imaging results that were available during my care of the patient were reviewed by me and considered in my medical decision making (see chart for details).    Pt is feeling much better.  No cause for cp or for difficulty swallowing.  Pt encouraged to f/u with GI if difficulty swallowing continues.  Return if worse.  Final Clinical Impressions(s) / ED Diagnoses   Final diagnoses:  Chest wall pain  Dysphagia, unspecified type    ED Discharge Orders        Ordered    cyclobenzaprine (FLEXERIL) 10 MG tablet  2 times daily PRN     01/13/18 1410       Jacalyn Lefevre, MD 01/13/18 1411

## 2018-01-13 NOTE — ED Notes (Signed)
BMP recollected and sent to main lab

## 2018-01-13 NOTE — ED Triage Notes (Signed)
Pt to ER for evaluation of left lower chest pain onset one month ago. Reports relieved with ice and worsened with movement. Pt in NAD. VSS.

## 2018-01-13 NOTE — ED Notes (Signed)
Lab called to report BMP is hemolyzed.  Needs another sample

## 2018-01-13 NOTE — ED Notes (Signed)
Pt stable, ambulatory, states understanding of discharge instructions 

## 2018-01-16 NOTE — ED Notes (Signed)
Pt. Called and left a message requesting a call back. Called pt. Back, no answer, voicemail is not set-up , 01/16/2018

## 2018-01-16 NOTE — ED Notes (Signed)
01/16/2018, Pt. Called and had a few questions concerning her discharge instructions.   Reviewed the dc instructions with pt. And all questions answered.

## 2018-08-31 ENCOUNTER — Emergency Department (HOSPITAL_COMMUNITY)
Admission: EM | Admit: 2018-08-31 | Discharge: 2018-08-31 | Disposition: A | Discharge status: Home / Self Care | Payer: Medicaid Other | Attending: Emergency Medicine | Admitting: Emergency Medicine

## 2018-08-31 ENCOUNTER — Other Ambulatory Visit: Payer: Self-pay

## 2018-08-31 ENCOUNTER — Emergency Department (HOSPITAL_COMMUNITY): Payer: Medicaid Other

## 2018-08-31 DIAGNOSIS — N760 Acute vaginitis: Secondary | ICD-10-CM

## 2018-08-31 DIAGNOSIS — R102 Pelvic and perineal pain: Secondary | ICD-10-CM

## 2018-08-31 DIAGNOSIS — N72 Inflammatory disease of cervix uteri: Secondary | ICD-10-CM

## 2018-08-31 DIAGNOSIS — Z79899 Other long term (current) drug therapy: Secondary | ICD-10-CM | POA: Insufficient documentation

## 2018-08-31 DIAGNOSIS — B9689 Other specified bacterial agents as the cause of diseases classified elsewhere: Secondary | ICD-10-CM

## 2018-08-31 LAB — URINALYSIS, ROUTINE W REFLEX MICROSCOPIC
Bilirubin Urine: NEGATIVE
Glucose, UA: NEGATIVE mg/dL
Hgb urine dipstick: NEGATIVE
Ketones, ur: NEGATIVE mg/dL
Leukocytes,Ua: NEGATIVE
Nitrite: NEGATIVE
Protein, ur: NEGATIVE mg/dL
Specific Gravity, Urine: 1.023 (ref 1.005–1.030)
pH: 5 (ref 5.0–8.0)

## 2018-08-31 LAB — BASIC METABOLIC PANEL
Anion gap: 8 (ref 5–15)
BUN: 13 mg/dL (ref 6–20)
CO2: 24 mmol/L (ref 22–32)
Calcium: 8.8 mg/dL — ABNORMAL LOW (ref 8.9–10.3)
Chloride: 109 mmol/L (ref 98–111)
Creatinine, Ser: 0.71 mg/dL (ref 0.44–1.00)
GFR calc Af Amer: >60 mL/min (ref >60)
GFR calc non Af Amer: >60 mL/min (ref >60)
Glucose, Bld: 85 mg/dL (ref 70–99)
Potassium: 4 mmol/L (ref 3.5–5.1)
Sodium: 141 mmol/L (ref 135–145)

## 2018-08-31 LAB — CBC WITH DIFFERENTIAL/PLATELET
Abs Immature Granulocytes: 0.01 10*3/uL (ref 0.00–0.07)
Basophils Absolute: 0 10*3/uL (ref 0.0–0.1)
Basophils Relative: 1 %
Eosinophils Absolute: 0.1 10*3/uL (ref 0.0–0.5)
Eosinophils Relative: 2 %
HCT: 39.6 % (ref 36.0–46.0)
Hemoglobin: 13.3 g/dL (ref 12.0–15.0)
Immature Granulocytes: 0 %
Lymphocytes Relative: 31 %
Lymphs Abs: 1.4 10*3/uL (ref 0.7–4.0)
MCH: 32.1 pg (ref 26.0–34.0)
MCHC: 33.6 g/dL (ref 30.0–36.0)
MCV: 95.7 fL (ref 80.0–100.0)
Monocytes Absolute: 0.3 10*3/uL (ref 0.1–1.0)
Monocytes Relative: 7 %
Neutro Abs: 2.8 10*3/uL (ref 1.7–7.7)
Neutrophils Relative %: 59 %
Platelets: 172 10*3/uL (ref 150–400)
RBC: 4.14 MIL/uL (ref 3.87–5.11)
RDW: 11.8 % (ref 11.5–15.5)
WBC: 4.7 10*3/uL (ref 4.0–10.5)
nRBC: 0 % (ref 0.0–0.2)

## 2018-08-31 LAB — WET PREP, GENITAL
Sperm: NONE SEEN
Trich, Wet Prep: NONE SEEN
Yeast Wet Prep HPF POC: NONE SEEN

## 2018-08-31 LAB — POC URINE PREG, ED: Preg Test, Ur: NEGATIVE

## 2018-08-31 MED ORDER — METRONIDAZOLE 500 MG PO TABS: 500.0000 mg | ORAL_TABLET | Freq: Two times a day (BID) | ORAL | 0 refills | Status: DC

## 2018-08-31 NOTE — ED Notes (Signed)
Pt verbalized understanding of discharge instructions and denies any further questions at this time.   

## 2018-08-31 NOTE — ED Triage Notes (Signed)
Pt endorses pelvic discomfort x 1 week. LMP x 1 week ago. Denies discharge/bleeding. Was told she has endometriosis but pt states "I don't think I have it because I don't hurt on my cycle" Recently came off of birth control.

## 2018-08-31 NOTE — ED Provider Notes (Signed)
[facility] EMERGENCY DEPARTMENT Provider Note   CSN: [id number] Arrival date & time: 08/31/18  0254   History   Chief Complaint Chief Complaint  Patient presents with  . Pelvic Pain  . congestion    HPI Adayah Genson is a 28 y.o. female.   HPI   28 year old female presents today with several complaints.  Patient notes chronic pelvic pain over the last 2 years.  Been seen several times for this, both by the OB/GYN at [facility] and also the health department in [place].  She was told she likely has endometriosis.  She was placed on birth control which helped improved her symptoms.  She recently discontinued birth control and notes the pain has come back.  She notes pain in the bilateral pelvis region, pain with intercourse and also having white vaginal discharge.  She notes she has had this discharge previously and was told she had bacterial vaginosis.  She denies any upper abdominal pain nausea vomiting or fever.  She is sexually active with her husband but has not been recently as it is uncomfortable with the pelvic pain.  Patient also notes that he had intermittent tightness in her airway.  Patient notes that sometimes she feels like it is hard to blow air out.  She denies any present shortness of breath chest pain productive cough or fever.  No history DVT or PE or any significant risk factors for the same.  She does note that recently she has been using a mask to prevent influenza.  She denies any urinary symptoms.  She has not used any medication for the pain.     Past Medical History:  Diagnosis Date  . Anemia     There are no active problems to display for this patient.   No past surgical history on file.   OB History   No obstetric history on file.      Home Medications    Prior to Admission medications   Medication Sig Start Date End Date Taking? Authorizing Provider  albuterol (PROVENTIL HFA;VENTOLIN HFA) 108 (90 BASE) MCG/ACT inhaler  Inhale 2 puffs into the lungs 4 (four) times daily. Patient not taking: Reported on 01/13/2018 10/21/13   Reuben Likes, MD  azithromycin (ZITHROMAX Z-PAK) 250 MG tablet Take as directed. Patient not taking: Reported on 01/13/2018 10/21/13   Reuben Likes, MD  cyclobenzaprine (FLEXERIL) 10 MG tablet Take 1 tablet (10 mg total) by mouth 2 (two) times daily as needed for muscle spasms. 01/13/18   Jacalyn Lefevre, MD  ibuprofen (ADVIL,MOTRIN) 800 MG tablet Take 1 tablet (800 mg total) by mouth every 8 (eight) hours as needed. Patient not taking: Reported on 01/13/2018 12/05/17   Maia Plan, MD  JUNEL FE 1/20 1-20 MG-MCG tablet Take 1 tablet by mouth daily. 09/23/17   [provider]  metroNIDAZOLE (FLAGYL) 500 MG tablet Take 1 tablet (500 mg total) by mouth 2 (two) times daily. 08/31/18   Mone Commisso, Tinnie Gens, PA-C  Multiple Vitamin (MULTIVITAMIN) capsule Take 1 capsule by mouth daily. Gummies    [provider]  traMADol (ULTRAM) 50 MG tablet 1 to 2 tabs every 8 hours as needed for cough. Patient not taking: Reported on 01/13/2018 10/21/13   Reuben Likes, MD    Family History No family history on file.  Social History Social History   Tobacco Use  . Smoking status: Never Smoker  . Smokeless tobacco: Never Used  Substance Use Topics  . Alcohol use: Never  Frequency: Never  . Drug use: Never     Allergies   Penicillins   Review of Systems Review of Systems  All other systems reviewed and are negative.    Physical Exam Updated Vital Signs BP 114/72 (BP Location: Right Arm)   Pulse (!) 57   Temp 98.5 F (36.9 C) (Oral)   Resp 17   LMP 08/24/2018 (Exact Date)   SpO2 99%   Physical Exam Vitals signs and nursing note reviewed.  Constitutional:      Appearance: She is well-developed.  HENT:     Head: Normocephalic and atraumatic.     Comments: Oropharynx clear, tonsils surgically absent bilateral, no erythema, no exudate or swelling, voice normal Eyes:      General: No scleral icterus.       Right eye: No discharge.        Left eye: No discharge.     Conjunctiva/sclera: Conjunctivae normal.     Pupils: Pupils are equal, round, and reactive to light.  Neck:     Musculoskeletal: Normal range of motion.     Vascular: No JVD.     Trachea: No tracheal deviation.  Cardiovascular:     Rate and Rhythm: Normal rate.  Pulmonary:     Effort: Pulmonary effort is normal. No respiratory distress.     Breath sounds: No stridor. No wheezing, rhonchi or rales.  Abdominal:     Comments: Tenderness to palpation of the pelvic region diffusely, nonfocal, no rebound or guarding, remainder abdomen soft nontender  Genitourinary:    Comments: Small amount of yellow sticky discharge noted in the vaginal vault, no purulence-cervix erythematous-no cervical motion tenderness, no adnexal tenderness or masses Musculoskeletal:     Comments: No edema to the lower extremities  Neurological:     Mental Status: She is alert and oriented to person, place, and time.     Coordination: Coordination normal.  Psychiatric:        Behavior: Behavior normal.        Thought Content: Thought content normal.        Judgment: Judgment normal.      ED Treatments / Results  Labs (all labs ordered are listed, but only abnormal results are displayed) Labs Reviewed  WET PREP, GENITAL - Abnormal; Notable for the following components:      Result Value   Clue Cells Wet Prep HPF POC PRESENT (*)    WBC, Wet Prep HPF POC PRESENT (*)    All other components within normal limits  BASIC METABOLIC PANEL - Abnormal; Notable for the following components:   Calcium 8.8 (*)    All other components within normal limits  URINALYSIS, ROUTINE W REFLEX MICROSCOPIC - Abnormal; Notable for the following components:   APPearance HAZY (*)    All other components within normal limits  CBC WITH DIFFERENTIAL/PLATELET  POC URINE PREG, ED  GC/CHLAMYDIA PROBE AMP ([place]) NOT AT [facility]     EKG None  Radiology [place] Transvaginal Non-ob  Result Date: 08/31/2018 CLINICAL DATA:  Pelvic pain.  Suspected ovarian torsion. EXAM: TRANSABDOMINAL AND TRANSVAGINAL ULTRASOUND OF PELVIS DOPPLER ULTRASOUND OF OVARIES TECHNIQUE: Both transabdominal and transvaginal ultrasound examinations of the pelvis were performed. Transabdominal technique was performed for global imaging of the pelvis including uterus, ovaries, adnexal regions, and pelvic cul-de-sac. It was necessary to proceed with endovaginal exam following the transabdominal exam to visualize the ovaries. Color and duplex Doppler ultrasound was utilized to evaluate blood flow to the ovaries. COMPARISON:  11/08/2015 FINDINGS:  Uterus Measurements: 10.6 x 4.5 x 5.5 cm = volume: 138 mL. No fibroids or other mass visualized. Endometrium Thickness: 15 mm.  No focal abnormality visualized. Right ovary Measurements: 3.4 x 1.7 x 1.2 cm = volume: 3.8 mL. Normal appearance/no adnexal mass. Left ovary Measurements: 2.8 x 1.8 x 2.2 cm = volume: 5.9 mL. Normal appearance/no adnexal mass. Pulsed Doppler evaluation of both ovaries demonstrates normal low-resistance arterial and venous waveforms. Other findings No abnormal free fluid. IMPRESSION: Normal appearance of uterus and both ovaries. No pelvic mass or other significant abnormality identified. No sonographic evidence for ovarian torsion. Electronically Signed   By: Myles RosenthalJohn  Stahl M.D.   On: 08/31/2018 12:09   [place] Pelvis Complete  Result Date: 08/31/2018 CLINICAL DATA:  Pelvic pain.  Suspected ovarian torsion. EXAM: TRANSABDOMINAL AND TRANSVAGINAL ULTRASOUND OF PELVIS DOPPLER ULTRASOUND OF OVARIES TECHNIQUE: Both transabdominal and transvaginal ultrasound examinations of the pelvis were performed. Transabdominal technique was performed for global imaging of the pelvis including uterus, ovaries, adnexal regions, and pelvic cul-de-sac. It was necessary to proceed with endovaginal exam following the transabdominal  exam to visualize the ovaries. Color and duplex Doppler ultrasound was utilized to evaluate blood flow to the ovaries. COMPARISON:  11/08/2015 FINDINGS: Uterus Measurements: 10.6 x 4.5 x 5.5 cm = volume: 138 mL. No fibroids or other mass visualized. Endometrium Thickness: 15 mm.  No focal abnormality visualized. Right ovary Measurements: 3.4 x 1.7 x 1.2 cm = volume: 3.8 mL. Normal appearance/no adnexal mass. Left ovary Measurements: 2.8 x 1.8 x 2.2 cm = volume: 5.9 mL. Normal appearance/no adnexal mass. Pulsed Doppler evaluation of both ovaries demonstrates normal low-resistance arterial and venous waveforms. Other findings No abnormal free fluid. IMPRESSION: Normal appearance of uterus and both ovaries. No pelvic mass or other significant abnormality identified. No sonographic evidence for ovarian torsion. Electronically Signed   By: Myles RosenthalJohn  Stahl M.D.   On: 08/31/2018 12:09   [place] Art/ven Flow Abd Pelv Doppler  Result Date: 08/31/2018 CLINICAL DATA:  Pelvic pain.  Suspected ovarian torsion. EXAM: TRANSABDOMINAL AND TRANSVAGINAL ULTRASOUND OF PELVIS DOPPLER ULTRASOUND OF OVARIES TECHNIQUE: Both transabdominal and transvaginal ultrasound examinations of the pelvis were performed. Transabdominal technique was performed for global imaging of the pelvis including uterus, ovaries, adnexal regions, and pelvic cul-de-sac. It was necessary to proceed with endovaginal exam following the transabdominal exam to visualize the ovaries. Color and duplex Doppler ultrasound was utilized to evaluate blood flow to the ovaries. COMPARISON:  11/08/2015 FINDINGS: Uterus Measurements: 10.6 x 4.5 x 5.5 cm = volume: 138 mL. No fibroids or other mass visualized. Endometrium Thickness: 15 mm.  No focal abnormality visualized. Right ovary Measurements: 3.4 x 1.7 x 1.2 cm = volume: 3.8 mL. Normal appearance/no adnexal mass. Left ovary Measurements: 2.8 x 1.8 x 2.2 cm = volume: 5.9 mL. Normal appearance/no adnexal mass. Pulsed Doppler  evaluation of both ovaries demonstrates normal low-resistance arterial and venous waveforms. Other findings No abnormal free fluid. IMPRESSION: Normal appearance of uterus and both ovaries. No pelvic mass or other significant abnormality identified. No sonographic evidence for ovarian torsion. Electronically Signed   By: Myles RosenthalJohn  Stahl M.D.   On: 08/31/2018 12:09    Procedures Procedures (including critical care time)  Medications Ordered in ED Medications - No data to display   Initial Impression / Assessment and Plan / ED Course  I have reviewed the triage vital signs and the nursing notes.  Pertinent labs & imaging results that were available during my care of the patient were reviewed by  me and considered in my medical decision making (see chart for details).     Labs: Wet prep, CBC, BMP, point-of-care urine prior, urinalysis  Imaging: Ultrasound pelvis complete  Consults:  Therapeutics:  Discharge Meds: Metronidazole  Assessment/Plan: 28 year old female presents today with complaints of pelvic pain.  This is chronic in nature.  She does have clue cells on her wet prep with no signs of STD.  Patient notes that that is not possible to have an STD as she uses protection even with her husband.  Patient will be treated for bacterial vaginosis.  Her ultrasound is reassuring with no significant abnormalities.  Patient has significant erythema of her cervix she notes that her last Pap was last year normal I discussed the findings with her and the need for follow-up with her OB/GYN this week for repeat evaluation, she assures her follow-up evaluation.  She is given strict return precautions, she verbalized understanding and agreement to today's plan.      Final Clinical Impressions(s) / ED Diagnoses   Final diagnoses:  Bacterial vaginosis  Pelvic pain in female  Cervicitis    ED Discharge Orders         Ordered    metroNIDAZOLE (FLAGYL) 500 MG tablet  2 times daily     08/31/18  1241           Eyvonne Mechanic, PA-C 08/31/18 1242    Terrilee Files, MD 08/31/18 [phone]

## 2018-08-31 NOTE — ED Notes (Signed)
ED Provider at bedside. 

## 2018-08-31 NOTE — ED Notes (Signed)
Pt back from Ultrasound

## 2018-08-31 NOTE — Discharge Instructions (Signed)
Please read the attached information.  If you develop any new or worsening signs or symptoms return immediately to the emergency room.  As we discussed I would like your OB/GYN to evaluate your cervix, please make this follow-up evaluation within the next week.  Please take medication as directed.

## 2018-08-31 NOTE — ED Notes (Signed)
Patient transported to Ultrasound 

## 2018-09-01 LAB — GC/CHLAMYDIA PROBE AMP (~~LOC~~) NOT AT ARMC
Chlamydia: NEGATIVE
Neisseria Gonorrhea: NEGATIVE

## 2018-10-31 IMAGING — CT CT ANGIO CHEST
2 of 6 series · 18 of 36 positions shown · IV contrast (iopamidol)
Comparison: None.

CLINICAL DATA: Left-sided chest pain for 1 month

EXAM:
CT ANGIOGRAPHY CHEST WITH CONTRAST
TECHNIQUE: Multidetector CT imaging of the chest was performed using the
standard protocol during bolus administration of intravenous
contrast. Multiplanar CT image reconstructions and MIPs were
obtained to evaluate the vascular anatomy.
CONTRAST:  100mL 0PD88O-5A2 IOPAMIDOL (0PD88O-5A2) INJECTION 76%

[Series 5: pe thins · axial · 0.61mm/px · z∈[-503,-272]mm · 17 of 261 slices shown]
[im 15/261  lung]
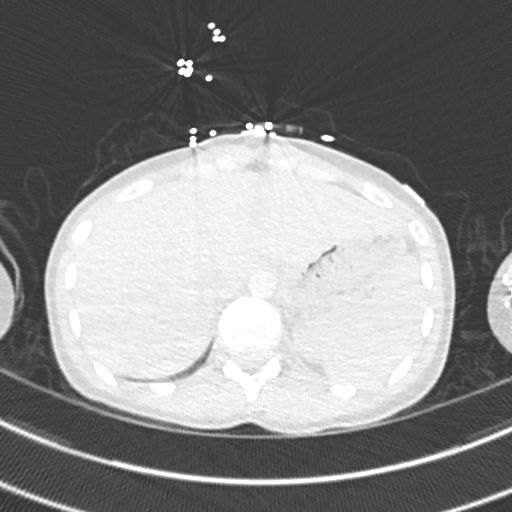
[im 29/261  mediastinal]
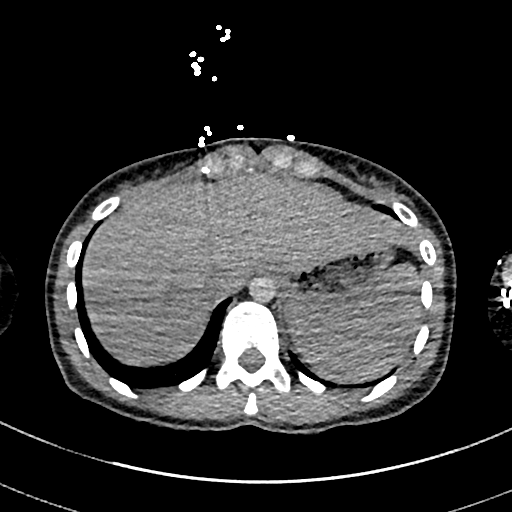
[im 44/261  lung]
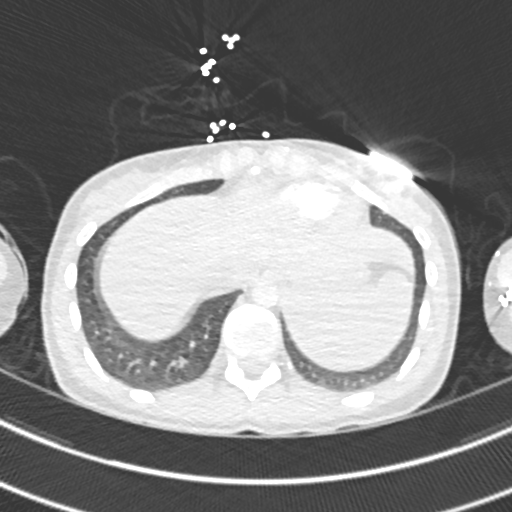
[im 58/261  mediastinal]
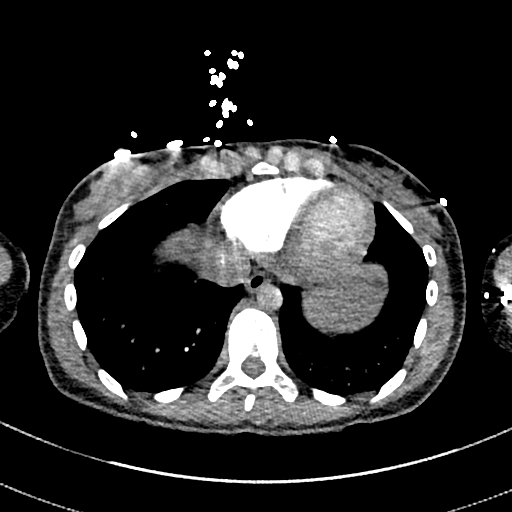
[im 73/261  lung]
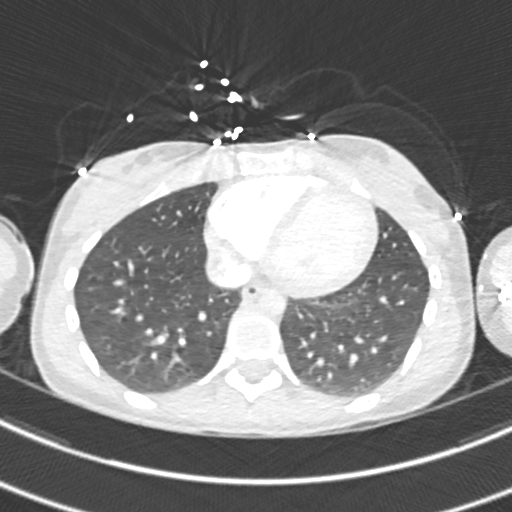
[im 87/261  mediastinal]
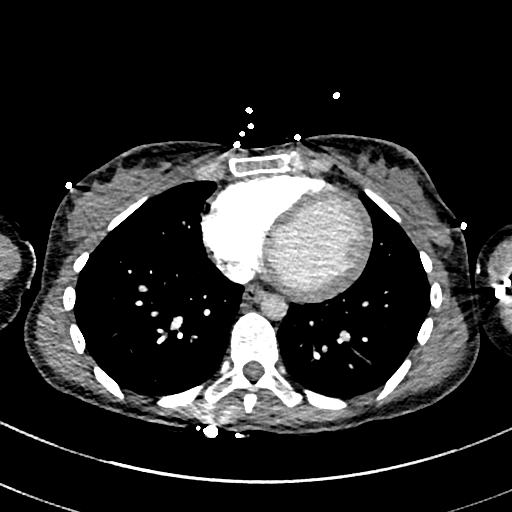
[im 102/261  lung]
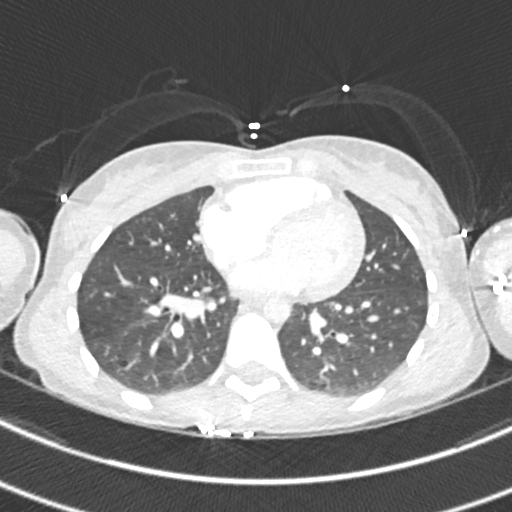
[im 116/261  mediastinal]
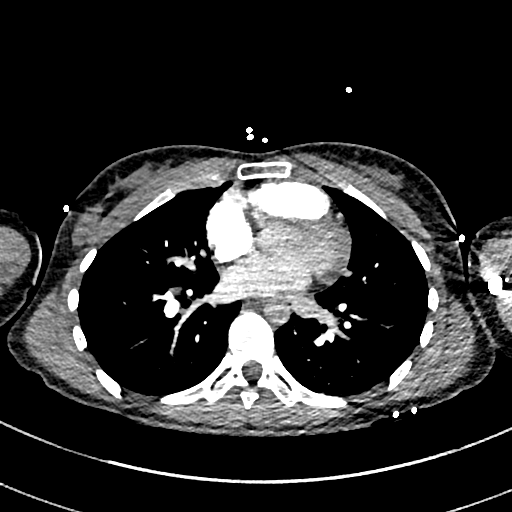
[im 131/261  lung]
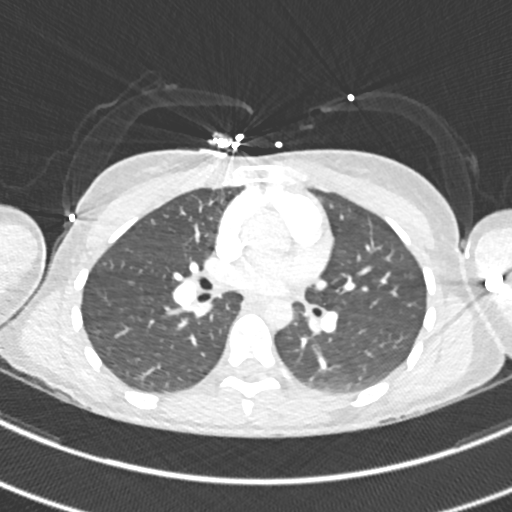
[im 145/261  mediastinal]
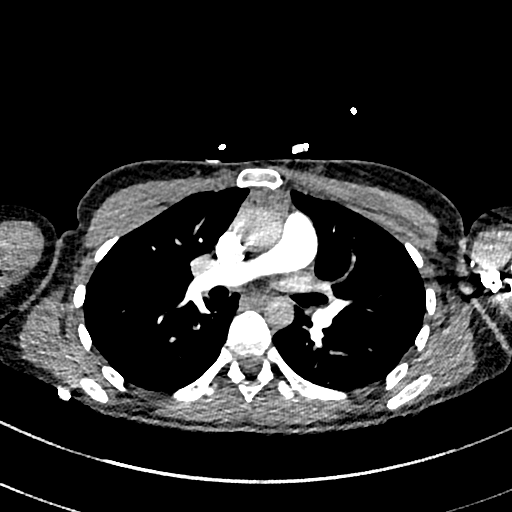
[im 159/261  lung]
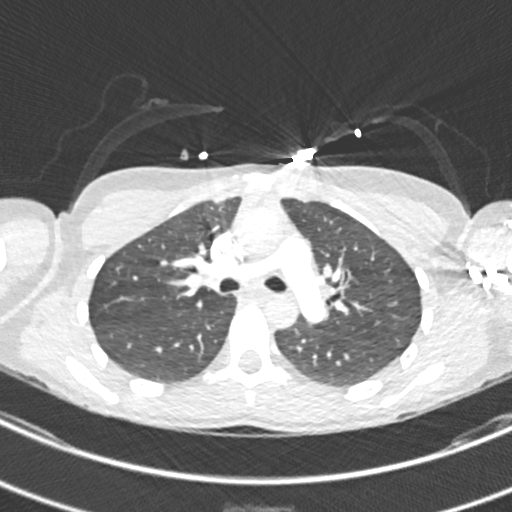
[im 174/261  mediastinal]
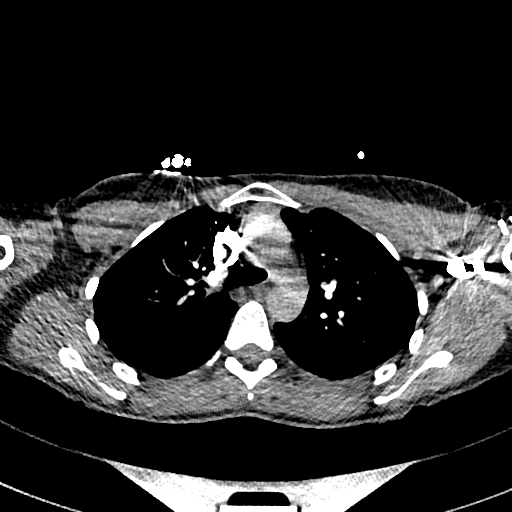
[im 188/261  lung]
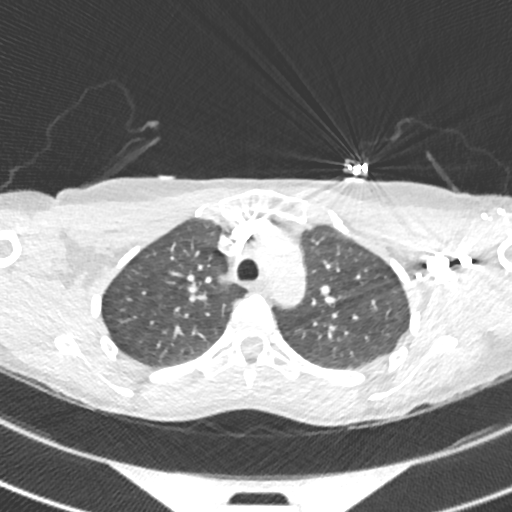
[im 203/261  mediastinal]
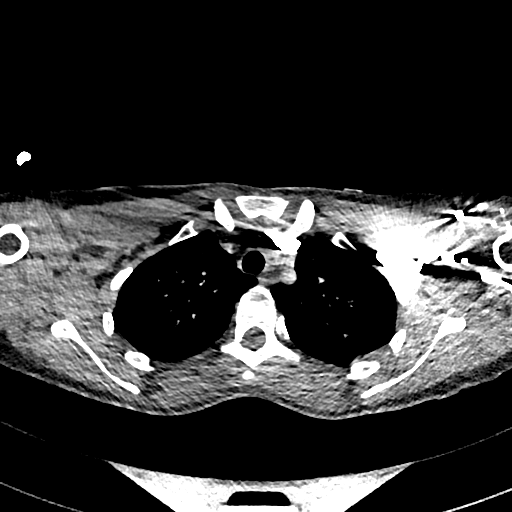
[im 217/261  lung]
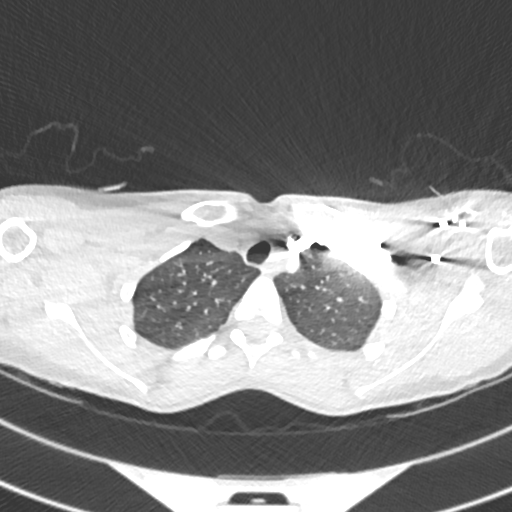
[im 232/261  mediastinal]
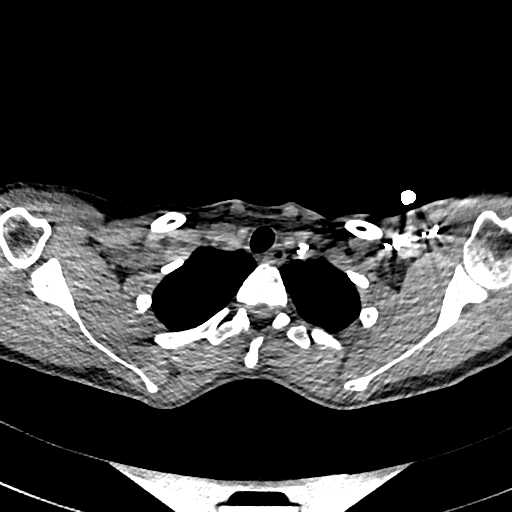
[im 246/261  lung]
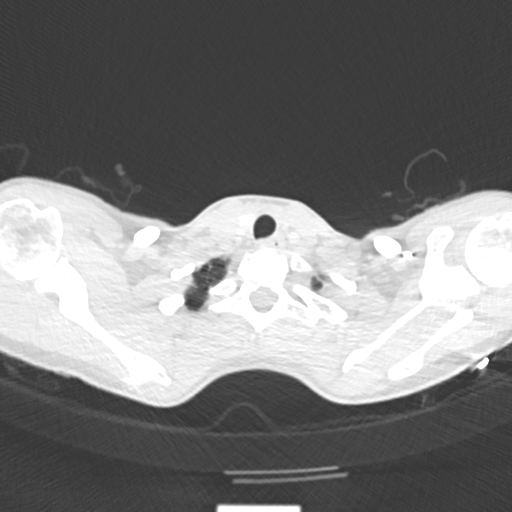

[Series 6: pe 2mm cor · coronal · 0.52mm/px · 1 of 116 slices shown]
[im 58/116  mediastinal]
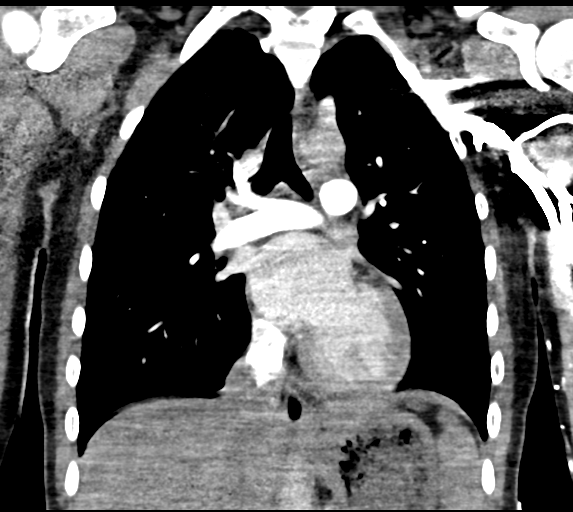

[18 of 36 positions shown; findings below may reference images not displayed]

FINDINGS: Cardiovascular: Thoracic aorta is incompletely opacified although
within normal limits without atherosclerotic change or aneurysmal
dilatation. No cardiac enlargement is seen. The pulmonary artery
shows a normal branching pattern without intraluminal filling defect
to suggest pulmonary embolism.

Mediastinum/Nodes: Thoracic inlet is within normal limits. No
significant hilar or mediastinal adenopathy is noted. The esophagus
is within normal limits.

Lungs/Pleura: The lungs are well aerated bilaterally. No focal
infiltrate or sizable effusion is seen. No pneumothorax is noted. A
vague 4 mm subpleural nodule is noted in the left upper lobe
posteriorly best seen on image number 34 of series 4. No other
significant nodules are seen.

Upper Abdomen: Visualized upper abdomen is unremarkable.

Musculoskeletal: No chest wall abnormality. No acute or significant
osseous findings.

Review of the MIP images confirms the above findings.
IMPRESSION: No evidence of pulmonary emboli.

4 mm subpleural nodule in the left upper lobe as described. No
follow-up needed if patient is low-risk. Non-contrast chest CT can
be considered in 12 months if patient is high-risk. This
recommendation follows the consensus statement: Guidelines for
Management of Incidental Pulmonary Nodules Detected on CT Images:

## 2019-06-18 IMAGING — US US TRANSVAGINAL NON-OB
1 series · 13 of 25 positions shown · non-contrast
Comparison: 11/08/2015

CLINICAL DATA: Pelvic pain.  Suspected ovarian torsion.

EXAM:
TRANSABDOMINAL AND TRANSVAGINAL ULTRASOUND OF PELVIS
DOPPLER ULTRASOUND OF OVARIES
TECHNIQUE: Both transabdominal and transvaginal ultrasound examinations of the
pelvis were performed. Transabdominal technique was performed for
global imaging of the pelvis including uterus, ovaries, adnexal
regions, and pelvic cul-de-sac.
It was necessary to proceed with endovaginal exam following the
transabdominal exam to visualize the ovaries. Color and duplex
Doppler ultrasound was utilized to evaluate blood flow to the
ovaries.

[Series 1: us transvaginal non-ob · 13 of 93 slices shown]
[im 1/93]
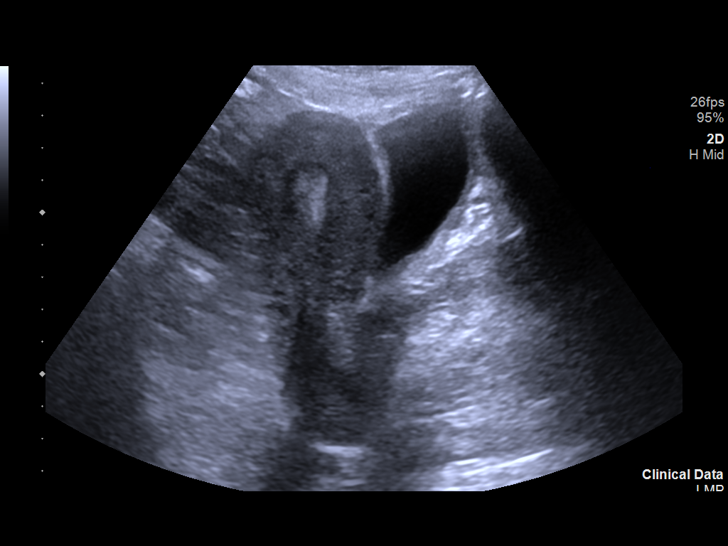
[im 8/93]
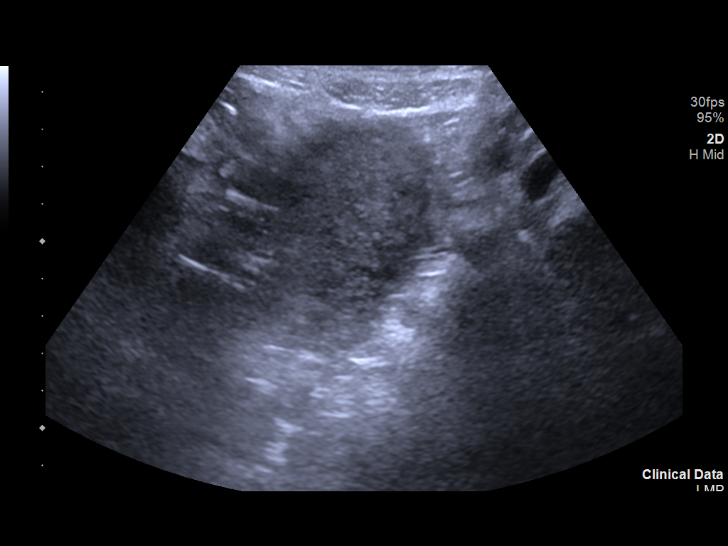
[im 16/93]
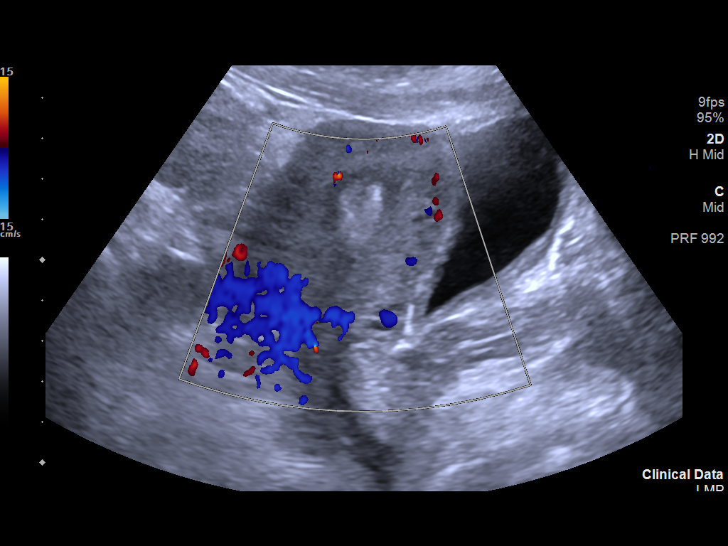
[im 24/93]
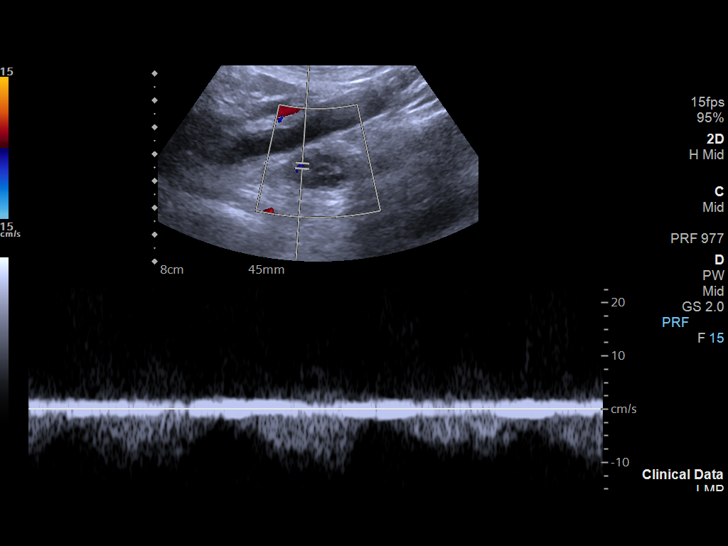
[im 31/93]
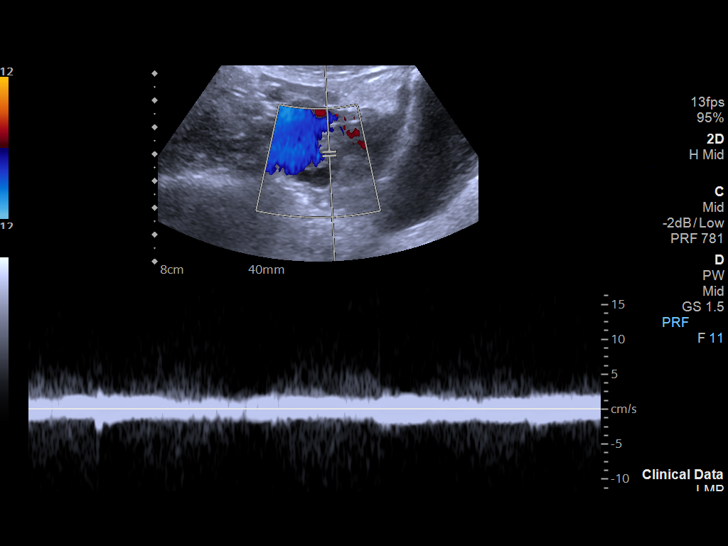
[im 39/93]
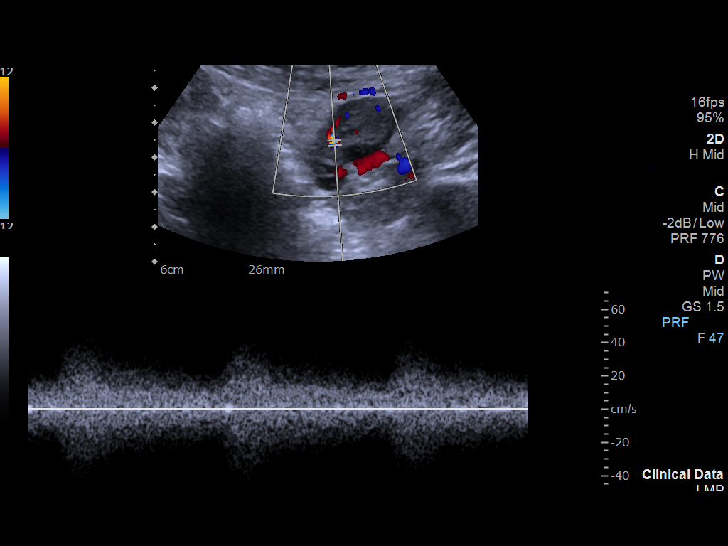
[im 47/93]
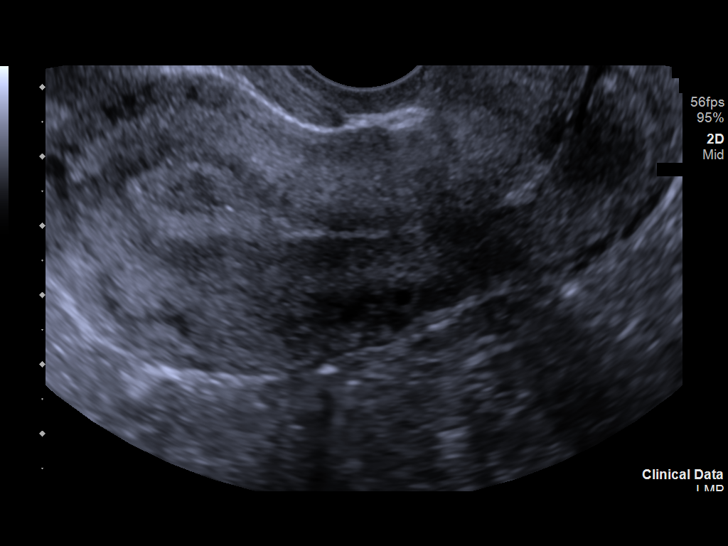
[im 54/93]
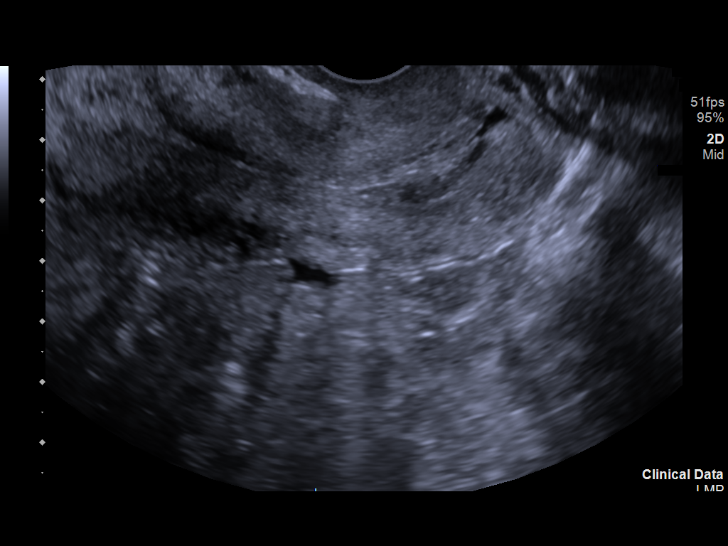
[im 62/93]
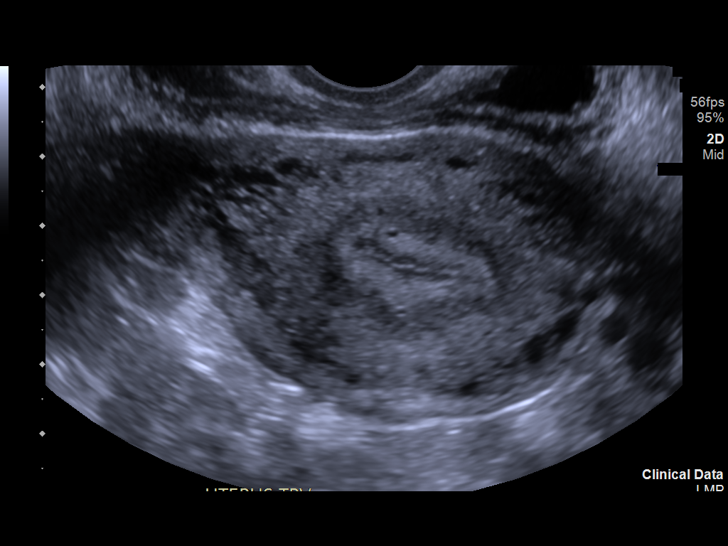
[im 70/93]
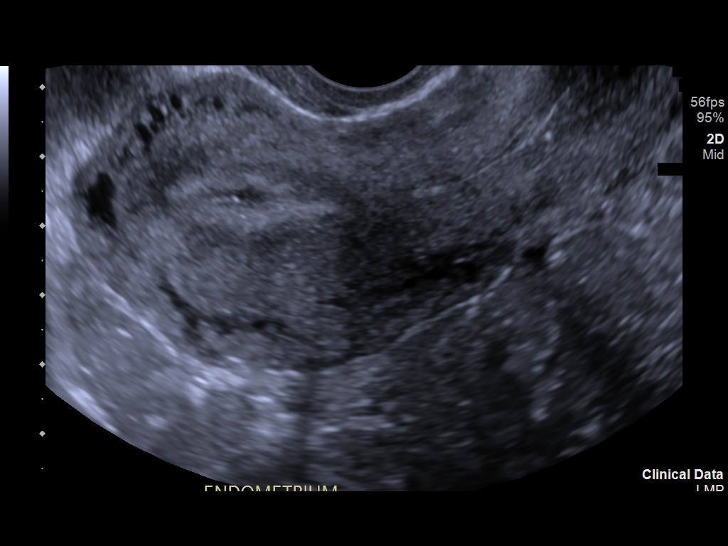
[im 77/93]
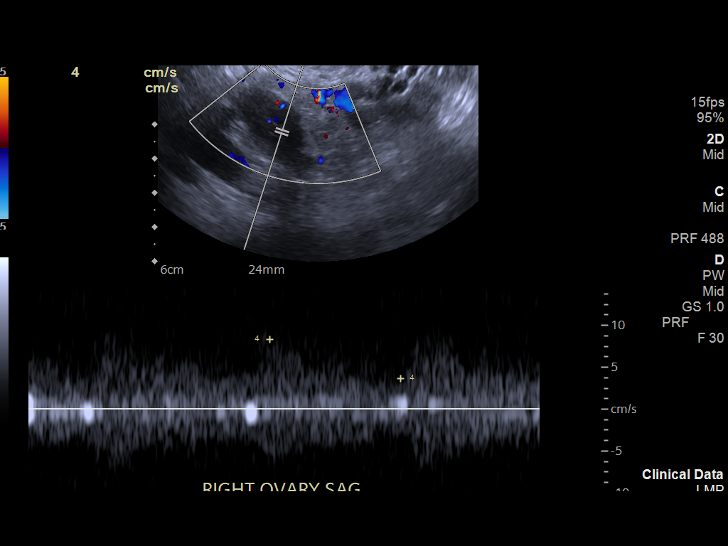
[im 85/93]
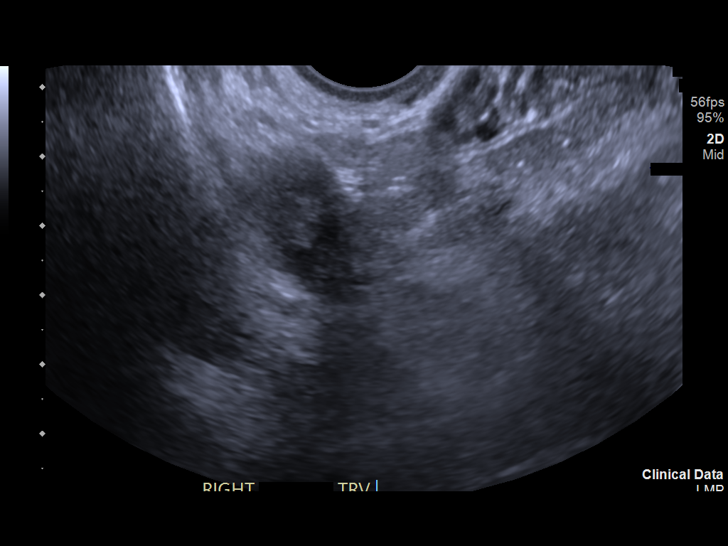
[im 93/93]
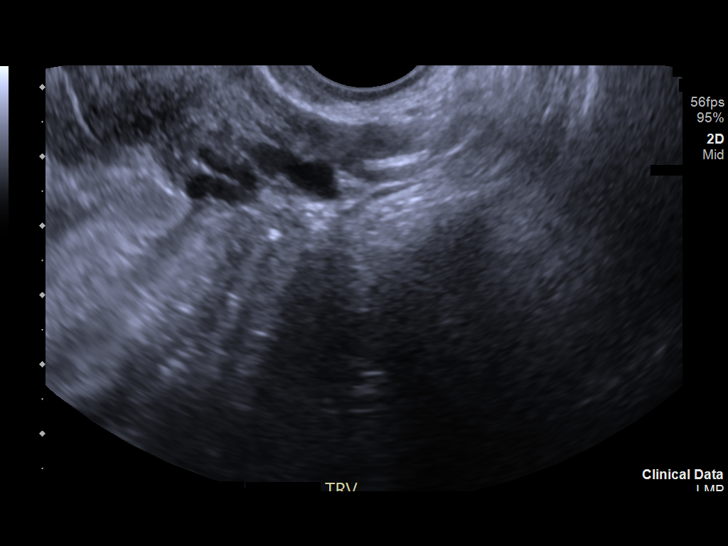

[13 of 25 positions shown; findings below may reference images not displayed]

FINDINGS: Uterus

Measurements: 10.6 x 4.5 x 5.5 cm = volume: 138 mL. No fibroids or
other mass visualized.

Endometrium

Thickness: 15 mm.  No focal abnormality visualized.

Right ovary

Measurements: 3.4 x 1.7 x 1.2 cm = volume: 3.8 mL. Normal
appearance/no adnexal mass.

Left ovary

Measurements: 2.8 x 1.8 x 2.2 cm = volume: 5.9 mL. Normal
appearance/no adnexal mass.

Pulsed Doppler evaluation of both ovaries demonstrates normal
low-resistance arterial and venous waveforms.

Other findings

No abnormal free fluid.
IMPRESSION: Normal appearance of uterus and both ovaries. No pelvic mass or
other significant abnormality identified.

No sonographic evidence for ovarian torsion.

## 2020-01-26 ENCOUNTER — Other Ambulatory Visit: Payer: Self-pay

## 2020-01-26 ENCOUNTER — Ambulatory Visit
Admission: EM | Admit: 2020-01-26 | Discharge: 2020-01-26 | Disposition: A | Discharge status: Home / Self Care | Payer: Medicaid Other

## 2020-01-26 DIAGNOSIS — Z4802 Encounter for removal of sutures: Secondary | ICD-10-CM

## 2020-01-26 NOTE — ED Triage Notes (Addendum)
Patient presents to have sutures removed from a laceration that occurred x 2 weeks ago. Sutures were placed at non[facility].  Area is well healed without signs of infection. Patient states she did receive a tetanus vaccine at the time of the accident. Sutures removed x 3, patient tolerated well.  Bacitracin ointment applied and covered with band-aid.

## 2020-10-19 ENCOUNTER — Emergency Department (HOSPITAL_COMMUNITY)
Admission: EM | Admit: 2020-10-19 | Discharge: 2020-10-19 | Discharge status: Against Medical Advice | Payer: Medicaid Other

## 2020-10-19 NOTE — ED Notes (Signed)
Pt left prior to triage, she stated "the wait was too long and she didn't have time to wait". Moved pt off the floor.

## 2020-10-20 ENCOUNTER — Emergency Department (HOSPITAL_COMMUNITY): Payer: Medicaid Other

## 2020-10-20 ENCOUNTER — Other Ambulatory Visit: Payer: Self-pay

## 2020-10-20 ENCOUNTER — Emergency Department (HOSPITAL_COMMUNITY)
Admission: EM | Admit: 2020-10-20 | Discharge: 2020-10-20 | Disposition: A | Discharge status: Home / Self Care | Payer: Medicaid Other | Attending: Emergency Medicine | Admitting: Emergency Medicine

## 2020-10-20 DIAGNOSIS — R102 Pelvic and perineal pain: Secondary | ICD-10-CM | POA: Diagnosis not present

## 2020-10-20 DIAGNOSIS — R519 Headache, unspecified: Secondary | ICD-10-CM | POA: Insufficient documentation

## 2020-10-20 DIAGNOSIS — R410 Disorientation, unspecified: Secondary | ICD-10-CM | POA: Diagnosis not present

## 2020-10-20 LAB — I-STAT BETA HCG BLOOD, ED (MC, WL, AP ONLY): I-stat hCG, quantitative: <5 m[IU]/mL (ref <5)

## 2020-10-20 NOTE — ED Triage Notes (Signed)
Pt reports sharp "jolts" across her head, brain fog, and intermittent dizziness x 1 week. Denies head injury. Also reports ongoing pelvic pain, for which she has seen OB/GYN and was told she might have endometriosis but needs either surgery or MRI to confirm so pt is requesting MRI.

## 2020-10-20 NOTE — ED Notes (Signed)
Patient verbalizes understanding of discharge instructions. Opportunity for questioning and answers were provided. Armband removed by staff, pt discharged from ED and ambulated to lobby to return home.   

## 2020-10-20 NOTE — Discharge Instructions (Addendum)
Follow-up with one of the neurology groups if the strange feeling in your head continues.  Follow-up with gynecology for the continued pelvic pain for further evaluation of this.

## 2020-10-20 NOTE — ED Provider Notes (Signed)
[facility] EMERGENCY DEPARTMENT Provider Note   CSN: [id number] Arrival date & time: 10/20/20  0109     History No chief complaint on file.   Barbara Hobbs is a 30 y.o. female.  HPI Patient presents with headache and feeling foggy.  States she will sometimes move her head and feel jolts across the back of it.  States that after that she will feel foggy for around half hour.  States it is not always come on with moving her head.  States it will sometimes come on on its own.  No trauma.  No fevers or chills.  No difficulty hearing.  States eyes feel blurry sometimes.  No weight loss. Also has continued pelvic pain.  Has seen OB/GYN for before.  Was told she potentially could have endometriosis but would need either a surgery or potentially an MRI to look for it.  States she did not really want have surgery and did not have the MRI done.  Has some pelvic discharge but states this is similar to before.  Does not think she is pregnant.  States she uses protection last menses was 3 weeks ago.    Past Medical History:  Diagnosis Date  . Anemia     There are no problems to display for this patient.   No past surgical history on file.   OB History   No obstetric history on file.     Family History  Problem Relation Age of Onset  . Healthy Mother   . Healthy Father     Social History   Tobacco Use  . Smoking status: Never Smoker  . Smokeless tobacco: Never Used  Substance Use Topics  . Alcohol use: Never  . Drug use: Never    Home Medications Prior to Admission medications   Medication Sig Start Date End Date Taking? Authorizing Provider  albuterol (PROVENTIL HFA;VENTOLIN HFA) 108 (90 BASE) MCG/ACT inhaler Inhale 2 puffs into the lungs 4 (four) times daily. Patient not taking: Reported on 01/13/2018 10/21/13   Reuben Likes, MD  azithromycin (ZITHROMAX Z-PAK) 250 MG tablet Take as directed. Patient not taking: Reported on 01/13/2018 10/21/13   Reuben Likes, MD  cyclobenzaprine (FLEXERIL) 10 MG tablet Take 1 tablet (10 mg total) by mouth 2 (two) times daily as needed for muscle spasms. 01/13/18   Jacalyn Lefevre, MD  ibuprofen (ADVIL,MOTRIN) 800 MG tablet Take 1 tablet (800 mg total) by mouth every 8 (eight) hours as needed. Patient not taking: Reported on 01/13/2018 12/05/17   Maia Plan, MD  JUNEL FE 1/20 1-20 MG-MCG tablet Take 1 tablet by mouth daily. 09/23/17   [provider]  medroxyPROGESTERone (DEPO-PROVERA) 150 MG/ML injection Inject into the muscle. 06/20/19   [provider]  metroNIDAZOLE (FLAGYL) 500 MG tablet Take 1 tablet (500 mg total) by mouth 2 (two) times daily. 08/31/18   Hedges, Tinnie Gens, PA-C  Multiple Vitamin (MULTIVITAMIN) capsule Take 1 capsule by mouth daily. Gummies    [provider]  traMADol (ULTRAM) 50 MG tablet 1 to 2 tabs every 8 hours as needed for cough. Patient not taking: Reported on 01/13/2018 10/21/13   Reuben Likes, MD    Allergies    Penicillins  Review of Systems   Review of Systems  Constitutional: Negative for appetite change and unexpected weight change.  HENT: Negative for congestion.   Eyes: Positive for visual disturbance.  Cardiovascular: Negative for chest pain.  Gastrointestinal: Positive for abdominal pain.  Genitourinary: Positive  for pelvic pain and vaginal discharge.  Musculoskeletal: Negative for back pain.  Skin: Negative for rash.  Neurological: Positive for headaches. Negative for speech difficulty.  Psychiatric/Behavioral: Negative for confusion.    Physical Exam Updated Vital Signs BP 99/64 (BP Location: Right Arm)   Pulse 62   Temp 98.2 F (36.8 C) (Oral)   Resp 16   Ht 5\' 7"  (1.702 m)   Wt 61.2 kg   LMP 09/29/2020 (Approximate)   SpO2 100%   BMI 21.14 kg/m   Physical Exam Vitals and nursing note reviewed.  HENT:     Head: Normocephalic and atraumatic.     Mouth/Throat:     Mouth: Mucous membranes are moist.  Eyes:      Extraocular Movements: Extraocular movements intact.     Pupils: Pupils are equal, round, and reactive to light.  Cardiovascular:     Rate and Rhythm: Regular rhythm.  Abdominal:     Tenderness: There is no abdominal tenderness.  Musculoskeletal:        General: No tenderness.     Cervical back: Neck supple. No tenderness.  Skin:    General: Skin is warm.     Capillary Refill: Capillary refill takes less than 2 seconds.  Neurological:     Mental Status: She is alert and oriented to person, place, and time.  Psychiatric:        Mood and Affect: Mood normal.     ED Results / Procedures / Treatments   Labs (all labs ordered are listed, but only abnormal results are displayed) Labs Reviewed  I-STAT BETA HCG BLOOD, ED ([facility], [facility], [facility] ONLY)    EKG None  Radiology CT Head Wo Contrast  Result Date: 10/20/2020 CLINICAL DATA:  Dizziness EXAM: CT HEAD WITHOUT CONTRAST TECHNIQUE: Contiguous axial images were obtained from the base of the skull through the vertex without intravenous contrast. COMPARISON:  None. FINDINGS: Brain: No evidence of acute infarction, hemorrhage, hydrocephalus, extra-axial collection or mass lesion/mass effect. Vascular: No hyperdense vessel or unexpected calcification. Skull: Normal. Negative for fracture or focal lesion. Sinuses/Orbits: Visualized paranasal sinuses and mastoid air cells are predominantly clear. Orbits are grossly unremarkable. Other: None IMPRESSION: No acute intracranial findings. Electronically Signed   By: 12/20/2020 MD   On: 10/20/2020 11:00    Procedures Procedures   Medications Ordered in ED Medications - No data to display  ED Course  I have reviewed the triage vital signs and the nursing notes.  Pertinent labs & imaging results that were available during my care of the patient were reviewed by me and considered in my medical decision making (see chart for details).    MDM Rules/Calculators/A&P                          Patient  with feeling of her head feeling full of liquid at times.  Comes and goes.  Sometimes positional.  Benign exam.  Head CT done reassuring.  Seems stable for outpatient follow-up. Initial differential diagnosis included but was not limited to headache, brain tumor, intracranial hemorrhage, Also has had chronic pelvic pain.  Has been told she needed either surgery or MRI.  Potentially endometriosis.  Discussed with patient about repeating pelvic exam and patient deferred at this time.  States will just show the same thing that has been there before.  Patient appears stable follow-up with GYN.  Pregnancy test negative. Final Clinical Impression(s) / ED Diagnoses Final diagnoses:  Nonintractable headache, unspecified  chronicity pattern, unspecified headache type    Rx / DC Orders ED Discharge Orders    None       Benjiman Core, MD 10/20/20 1245

## 2021-08-07 IMAGING — CT CT HEAD W/O CM
4 series · 16 of 47 positions shown, 18 images · non-contrast
Comparison: None.

CLINICAL DATA: Dizziness

EXAM:
CT HEAD WITHOUT CONTRAST
TECHNIQUE: Contiguous axial images were obtained from the base of the skull
through the vertex without intravenous contrast.

[Series 3: head wo · axial · 0.40mm/px · z∈[-96,+4]mm · 7 of 28 slices shown, 9 images]
[im 4/28  brain]
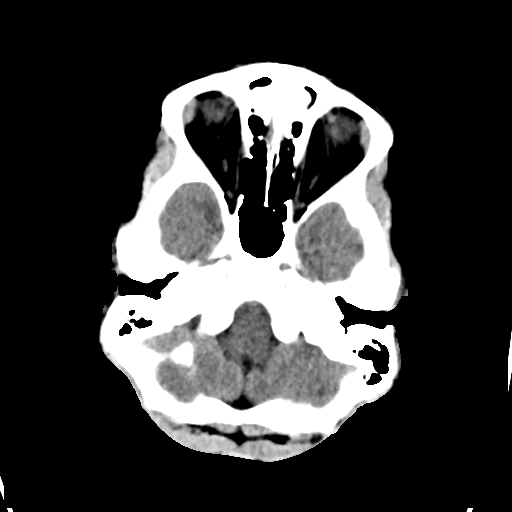
[im 4/28  bone]
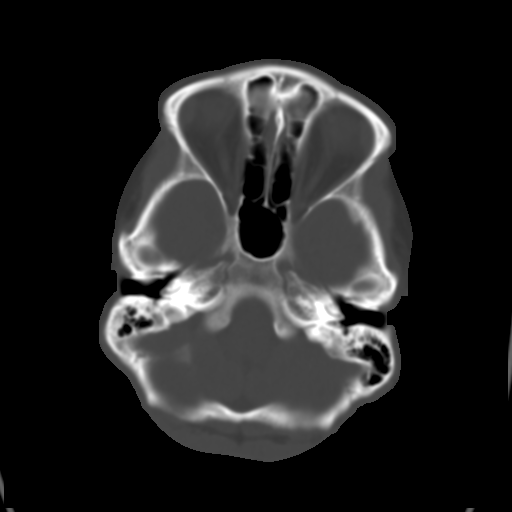
[im 7/28  brain]
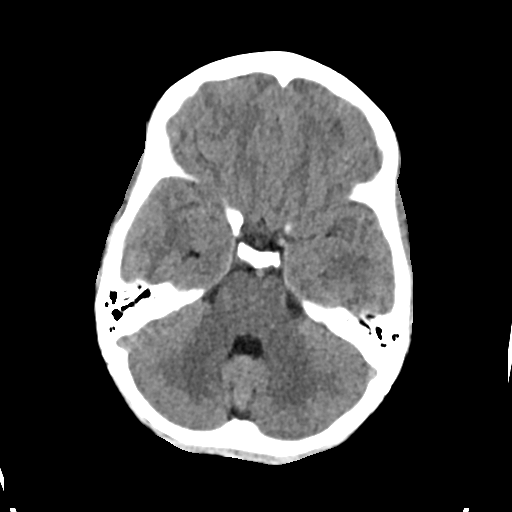
[im 11/28  brain]
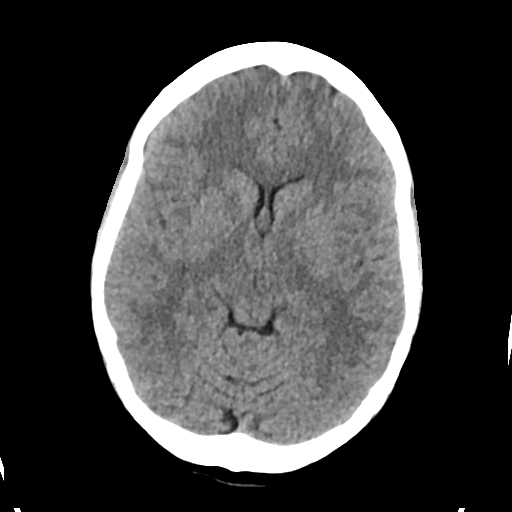
[im 14/28  brain]
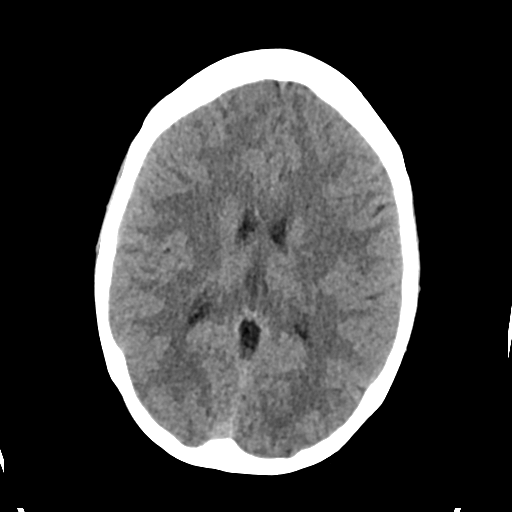
[im 17/28  brain]
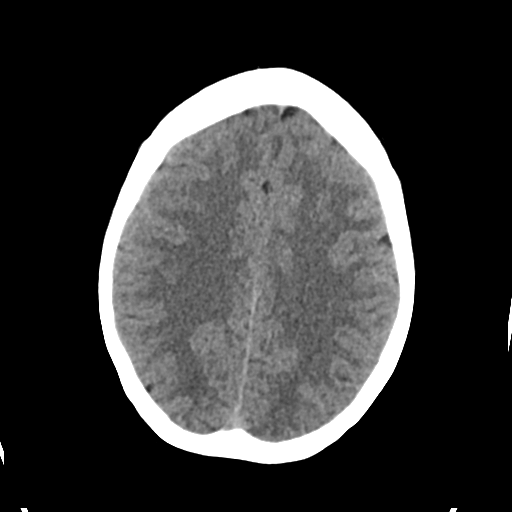
[im 17/28  bone]
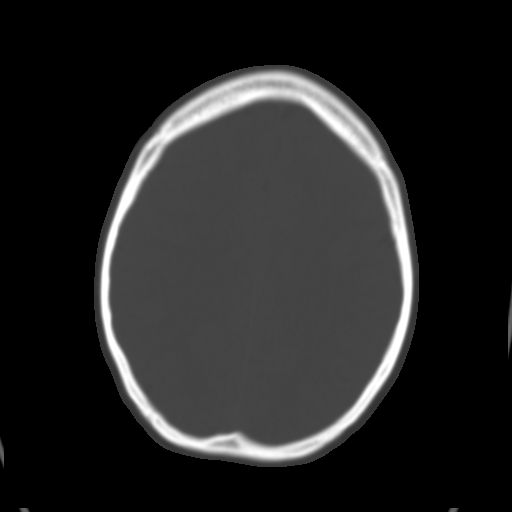
[im 21/28  brain]
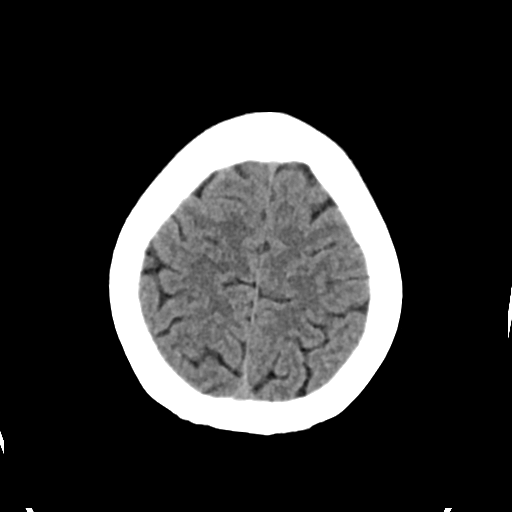
[im 24/28  brain]
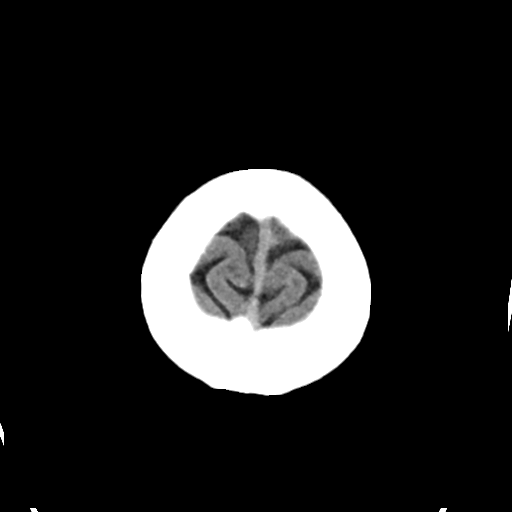

[Series 4: head bone · axial · 0.40mm/px · z∈[-100,-72]mm · 3 of 69 slices shown]
[im 7/69  bone]
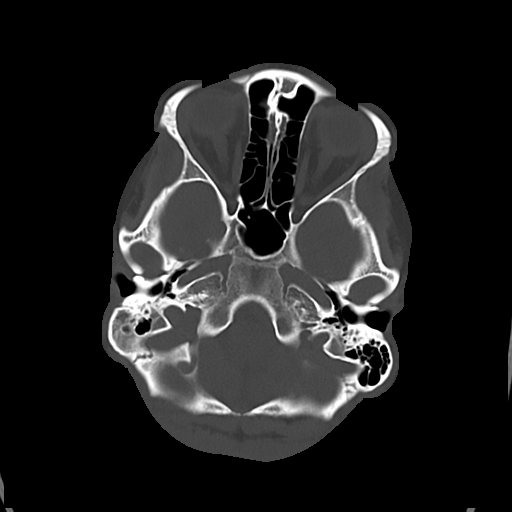
[im 14/69  bone]
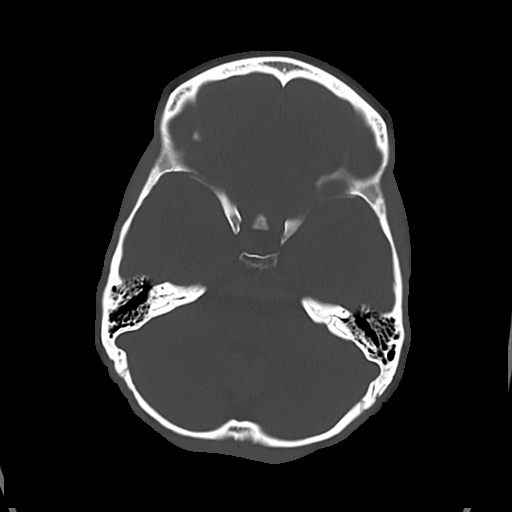
[im 21/69  bone]
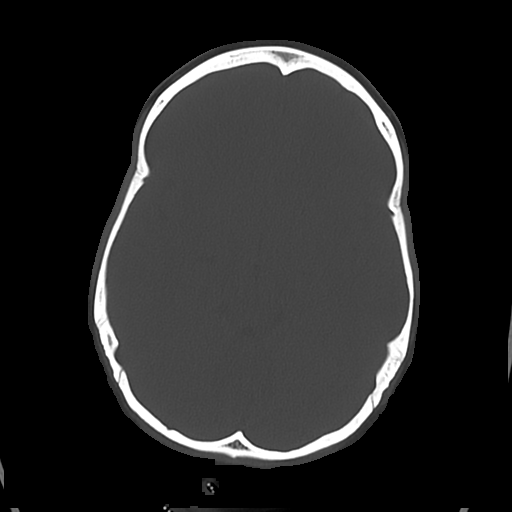

[Series 5: cor soft · coronal · 0.31mm/px · 3 of 63 slices shown]
[im 21/63  brain]
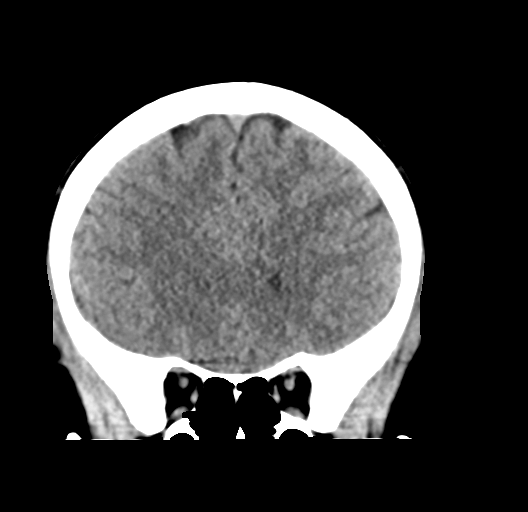
[im 28/63  brain]
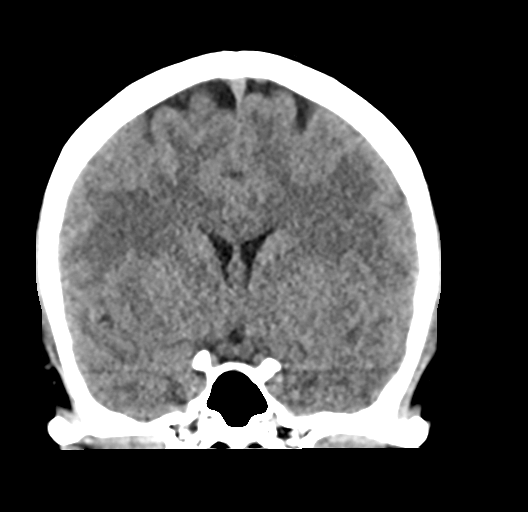
[im 35/63  brain]
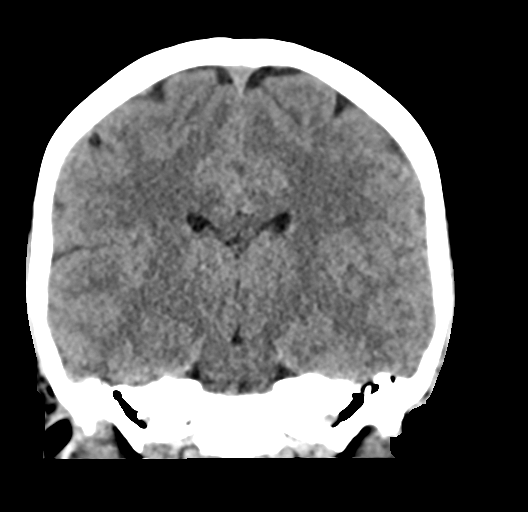

[Series 6: sag soft · sagittal · 0.30mm/px · 3 of 48 slices shown]
[im 16/48  brain]
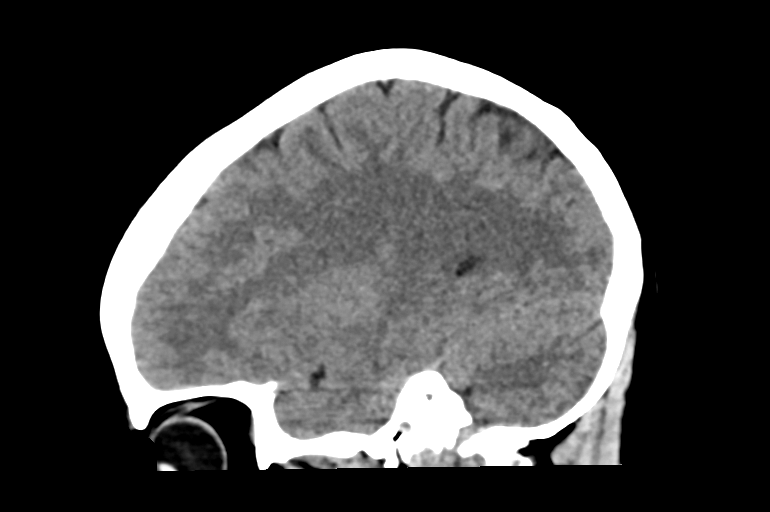
[im 24/48  brain]
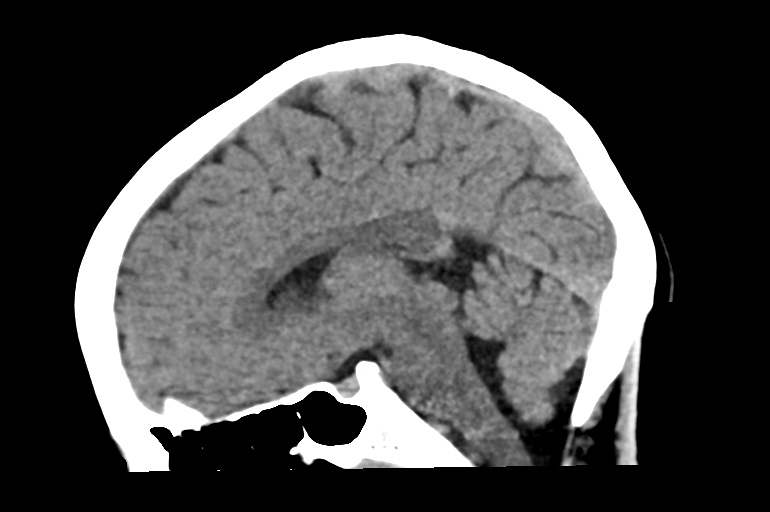
[im 32/48  brain]
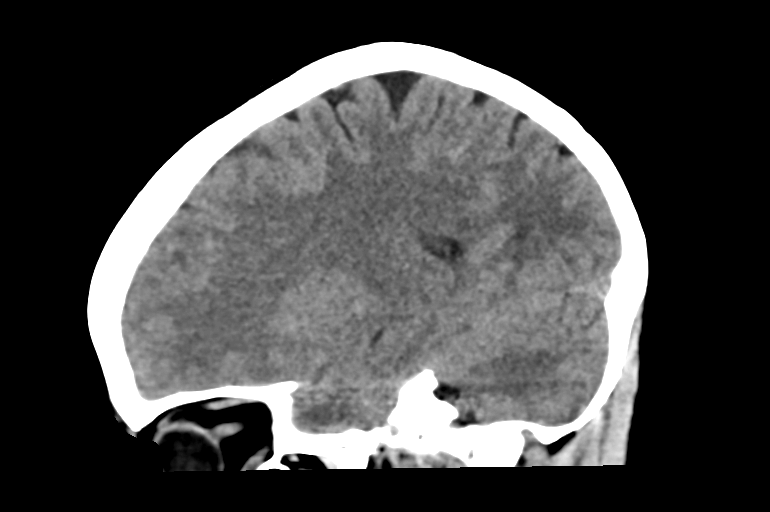

[16 of 47 positions shown; findings below may reference images not displayed]

FINDINGS: Brain: No evidence of acute infarction, hemorrhage, hydrocephalus,
extra-axial collection or mass lesion/mass effect.

Vascular: No hyperdense vessel or unexpected calcification.

Skull: Normal. Negative for fracture or focal lesion.

Sinuses/Orbits: Visualized paranasal sinuses and mastoid air cells
are predominantly clear. Orbits are grossly unremarkable.

Other: None
IMPRESSION: No acute intracranial findings.

## 2022-03-10 ENCOUNTER — Emergency Department (HOSPITAL_COMMUNITY)
Admission: EM | Admit: 2022-03-10 | Discharge: 2022-03-10 | Discharge status: Against Medical Advice | Payer: Medicaid Other | Attending: Student | Admitting: Student

## 2022-03-10 ENCOUNTER — Encounter (HOSPITAL_COMMUNITY): Payer: Self-pay | Admitting: Emergency Medicine

## 2022-03-10 DIAGNOSIS — Z20822 Contact with and (suspected) exposure to covid-19: Secondary | ICD-10-CM | POA: Insufficient documentation

## 2022-03-10 DIAGNOSIS — R519 Headache, unspecified: Secondary | ICD-10-CM | POA: Insufficient documentation

## 2022-03-10 DIAGNOSIS — Z5321 Procedure and treatment not carried out due to patient leaving prior to being seen by health care provider: Secondary | ICD-10-CM | POA: Insufficient documentation

## 2022-03-10 DIAGNOSIS — R110 Nausea: Secondary | ICD-10-CM | POA: Insufficient documentation

## 2022-03-10 DIAGNOSIS — R5383 Other fatigue: Secondary | ICD-10-CM | POA: Diagnosis not present

## 2022-03-10 LAB — URINALYSIS, ROUTINE W REFLEX MICROSCOPIC
Bacteria, UA: NONE SEEN
Bilirubin Urine: NEGATIVE
Glucose, UA: NEGATIVE mg/dL
Hgb urine dipstick: NEGATIVE
Ketones, ur: NEGATIVE mg/dL
Nitrite: NEGATIVE
Protein, ur: NEGATIVE mg/dL
Specific Gravity, Urine: 1.005 (ref 1.005–1.030)
pH: 8 (ref 5.0–8.0)

## 2022-03-10 LAB — COMPREHENSIVE METABOLIC PANEL
ALT: 21 U/L (ref 0–44)
AST: 17 U/L (ref 15–41)
Albumin: 4 g/dL (ref 3.5–5.0)
Alkaline Phosphatase: 51 U/L (ref 38–126)
Anion gap: 8 (ref 5–15)
BUN: 12 mg/dL (ref 6–20)
CO2: 24 mmol/L (ref 22–32)
Calcium: 9.4 mg/dL (ref 8.9–10.3)
Chloride: 110 mmol/L (ref 98–111)
Creatinine, Ser: 0.73 mg/dL (ref 0.44–1.00)
GFR, Estimated: >60 mL/min (ref >60)
Glucose, Bld: 105 mg/dL — ABNORMAL HIGH (ref 70–99)
Potassium: 4.4 mmol/L (ref 3.5–5.1)
Sodium: 142 mmol/L (ref 135–145)
Total Bilirubin: 0.9 mg/dL (ref 0.3–1.2)
Total Protein: 6.4 g/dL — ABNORMAL LOW (ref 6.5–8.1)

## 2022-03-10 LAB — CBC WITH DIFFERENTIAL/PLATELET
Abs Immature Granulocytes: 0.03 10*3/uL (ref 0.00–0.07)
Basophils Absolute: 0.1 10*3/uL (ref 0.0–0.1)
Basophils Relative: 1 %
Eosinophils Absolute: 0.1 10*3/uL (ref 0.0–0.5)
Eosinophils Relative: 2 %
HCT: 43.9 % (ref 36.0–46.0)
Hemoglobin: 14.9 g/dL (ref 12.0–15.0)
Immature Granulocytes: 1 %
Lymphocytes Relative: 25 %
Lymphs Abs: 1.4 10*3/uL (ref 0.7–4.0)
MCH: 33 pg (ref 26.0–34.0)
MCHC: 33.9 g/dL (ref 30.0–36.0)
MCV: 97.3 fL (ref 80.0–100.0)
Monocytes Absolute: 0.3 10*3/uL (ref 0.1–1.0)
Monocytes Relative: 6 %
Neutro Abs: 3.5 10*3/uL (ref 1.7–7.7)
Neutrophils Relative %: 65 %
Platelets: 224 10*3/uL (ref 150–400)
RBC: 4.51 MIL/uL (ref 3.87–5.11)
RDW: 12 % (ref 11.5–15.5)
WBC: 5.4 10*3/uL (ref 4.0–10.5)
nRBC: 0 % (ref 0.0–0.2)

## 2022-03-10 LAB — SARS CORONAVIRUS 2 BY RT PCR: SARS Coronavirus 2 by RT PCR: NEGATIVE

## 2022-03-10 LAB — LIPASE, BLOOD: Lipase: 30 U/L (ref 11–51)

## 2022-03-10 NOTE — ED Provider Triage Note (Signed)
Emergency Medicine Provider Triage Evaluation Note  Barbara Hobbs , a 31 y.o. female  was evaluated in triage.  Pt complains of headache, nausea, and fatigue x4 days.  Denies abdominal pain, chest pain, shortness of breath.  No fever or chills.  Denies sore throat and cough.  No sick contacts.  Review of Systems  Positive: Nausea, headache, fatigue Negative: fever  Physical Exam  BP 111/70   Pulse 60   Temp 98.4 F (36.9 C) (Oral)   Resp 16   SpO2 99%  Gen:   Awake, no distress   Resp:  Normal effort  MSK:   Moves extremities without difficulty  Other:    Medical Decision Making  Medically screening exam initiated at 4:15 PM.  Appropriate orders placed.  Barbara Hobbs was informed that the remainder of the evaluation will be completed by another provider, this initial triage assessment does not replace that evaluation, and the importance of remaining in the ED until their evaluation is complete.  Labs, COVID test   Barbara Hobbs, [place] 03/10/22 1617

## 2022-03-10 NOTE — ED Notes (Signed)
X2 no response °

## 2022-03-10 NOTE — ED Triage Notes (Signed)
Patient here with complaint of headache, nausea, and fatigue for the last four days. Patient denies diarrhea, denies shortness of breath. Patient is alert, oriented, afebrile, and in no apparent distress at this time.

## 2022-05-13 ENCOUNTER — Ambulatory Visit: Payer: Medicaid Other | Admitting: Obstetrics and Gynecology

## 2022-05-13 NOTE — Progress Notes (Deleted)
[facility] New Patient Evaluation and Consultation  Referring Provider: No ref. provider found PCP: Pcp, No Date of Service: 05/13/2022  SUBJECTIVE Chief Complaint: No chief complaint on file.  History of Present Illness: Barbara Hobbs is a 31 y.o. {ED SANE [phone] female seen in consultation at the request of Dr. No ref. provider found for evaluation of urinary and fecal incontinence.    ***Review of records significant for: ***  Urinary Symptoms: {urine leakage?:24754} Leaks *** time(s) per {days/wks/mos/yrs:310907}.  Pad use: {NUMBERS 1-10:18281} {pad option:24752} per day.   She {ACTION; IS/IS TGG:26948546} bothered by her UI symptoms.  Day time voids ***.  Nocturia: *** times per night to void. Voiding dysfunction: she {empties:24755} her bladder well.  {DOES NOT does:27190::"does not"} use a catheter to empty bladder.  When urinating, she feels {urine symptoms:24756} Drinks: *** per day  UTIs: {NUMBERS 1-10:18281} UTI's in the last year.   {ACTIONS;DENIES/REPORTS:21021675::"Denies"} history of {urologic concerns:24757}  Pelvic Organ Prolapse Symptoms:                  She {denies/ admits to:24761} a feeling of a bulge the vaginal area. It has been present for {NUMBER 1-10:22536} {days/wks/mos/yrs:310907}.  She {denies/ admits to:24761} seeing a bulge.  This bulge {ACTION; IS/IS EVO:35009381} bothersome.  Bowel Symptom: Bowel movements: *** time(s) per {Time; day/week/month:13537} Stool consistency: {stool consistency:24758} Straining: {yes/no:19897}.  Splinting: {yes/no:19897}.  Incomplete evacuation: {yes/no:19897}.  She {denies/ admits to:24761} accidental bowel leakage / fecal incontinence  Occurs: *** time(s) per {Time; day/week/month:13537}  Consistency with leakage: {stool consistency:24758} Bowel regimen: {bowel regimen:24759} Last colonoscopy: Date ***, Results ***  Sexual Function Sexually active: {yes/no:19897}.  Sexual  orientation: {Sexual Orientation:2343370267} Pain with sex: {pain with sex:24762}  Pelvic Pain {denies/ admits to:24761} pelvic pain Location: *** Pain occurs: *** Prior pain treatment: *** Improved by: *** Worsened by: ***   Past Medical History:  Past Medical History:  Diagnosis Date   Anemia      Past Surgical History:  No past surgical history on file.   Past OB/GYN History: G3 P3 Vaginal deliveries: ***,  Forceps/ Vacuum deliveries: ***, Cesarean section: *** Menopausal: {menopausal:24763} Contraception: ***. Last pap smear was ***.  Any history of abnormal pap smears: {yes/no:19897}.   Medications: She has a current medication list which includes the following prescription(s): albuterol, azithromycin, cyclobenzaprine, ibuprofen, junel fe 1/20, medroxyprogesterone, metronidazole, multivitamin, and tramadol.   Allergies: Patient is allergic to penicillins.   Social History:  Social History   Tobacco Use   Smoking status: Never   Smokeless tobacco: Never  Substance Use Topics   Alcohol use: Never   Drug use: Never    Relationship status: {relationship status:24764} She lives with ***.   She {ACTION; IS/IS WEX:93716967} employed ***. Regular exercise: {Yes/No:304960894} History of abuse: {Yes/No:304960894}  Family History:   Family History  Problem Relation Age of Onset   Healthy Mother    Healthy Father      Review of Systems: ROS   OBJECTIVE Physical Exam: There were no vitals filed for this visit.  Physical Exam   GU / Detailed Urogynecologic Evaluation:  Pelvic Exam: Normal external female genitalia; Bartholin's and Skene's glands normal in appearance; urethral meatus normal in appearance, no urethral masses or discharge.   CST: {gen negative/positive:315881}  Reflexes: bulbocavernosis {DESC; PRESENT/NOT PRESENT:21021351}, anocutaneous {DESC; PRESENT/NOT PRESENT:21021351} ***bilaterally.  Speculum exam reveals normal vaginal mucosa  {With/Without:20273} atrophy. Cervix {exam; gyn cervix:30847}. Uterus {exam; pelvic uterus:30849}. Adnexa {exam; adnexa:12223}.    s/p hysterectomy: Speculum exam reveals  normal vaginal mucosa {With/Without:20273}  atrophy and normal vaginal cuff.  Adnexa {exam; adnexa:12223}.    With apex supported, anterior compartment defect was {reduced:24765}  Pelvic floor strength {Roman # I-V:19040}/V, puborectalis {Roman # I-V:19040}/V external anal sphincter {Roman # I-V:19040}/V  Pelvic floor musculature: Right levator {Tender/Non-tender:20250}, Right obturator {Tender/Non-tender:20250}, Left levator {Tender/Non-tender:20250}, Left obturator {Tender/Non-tender:20250}  POP-Q:   POP-Q                                               Aa                                               Ba                                                 C                                                Gh                                               Pb                                               tvl                                                Ap                                               Bp                                                 D      Rectal Exam:  Normal sphincter tone, {rectocele:24766} distal rectocele, enterocoele {DESC; PRESENT/NOT PRESENT:21021351}, no rectal masses, {sign of:24767} dyssynergia when asking the patient to bear down.  Post-Void Residual (PVR) by Bladder Scan: In order to evaluate bladder emptying, we discussed obtaining a postvoid residual and she agreed to this procedure.  Procedure: The ultrasound unit was placed on the patient's abdomen in the suprapubic region after the patient had voided. A PVR of *** ml was obtained by bladder scan.  Laboratory Results: @ENCLABS @   ***I visualized the urine specimen, noting the specimen to be {urine color:24768}  ASSESSMENT AND PLAN  Ms. Boundy is a 31 y.o. with: No diagnosis found.    Berton Mount, NP   Medical  Decision Making:  - Reviewed/ ordered a clinical laboratory test - Reviewed/ ordered a radiologic study - Reviewed/ ordered medicine test - Decision to obtain old records - Discussion of management of or test interpretation with an external physician / other healthcare professional  - Assessment requiring independent historian - Review and summation of prior records - Independent review of image, tracing or specimen

## 2022-07-09 ENCOUNTER — Ambulatory Visit: Payer: Medicaid Other | Admitting: Obstetrics and Gynecology

## 2022-07-09 NOTE — Progress Notes (Deleted)
[facility] New Patient Evaluation and Consultation  Referring Provider: Lezlie Lye, Meda Coffee, * PCP: Pcp, No Date of Service: 07/09/2022  SUBJECTIVE Chief Complaint: No chief complaint on file.  History of Present Illness: Barbara Hobbs is a 31 y.o. {ED SANE [phone] female seen in consultation at the request of Dr. Lezlie Lye for evaluation of ***.    ***Review of records significant for: Has urinary and fecal incontinence over the last few months. Leakage noted with coughing. Decreased internal anal sphincter tone.   Urinary Symptoms: {urine leakage?:24754} Leaks *** time(s) per {days/wks/mos/yrs:310907}.  Pad use: {NUMBERS 1-10:18281} {pad option:24752} per day.   She {ACTION; IS/IS ASN:05397673} bothered by her UI symptoms.  Day time voids ***.  Nocturia: *** times per night to void. Voiding dysfunction: she {empties:24755} her bladder well.  {DOES NOT does:27190::"does not"} use a catheter to empty bladder.  When urinating, she feels {urine symptoms:24756} Drinks: *** per day  UTIs: {NUMBERS 1-10:18281} UTI's in the last year.   {ACTIONS;DENIES/REPORTS:21021675::"Denies"} history of {urologic concerns:24757}  Pelvic Organ Prolapse Symptoms:                  She {denies/ admits to:24761} a feeling of a bulge the vaginal area. It has been present for {NUMBER 1-10:22536} {days/wks/mos/yrs:310907}.  She {denies/ admits to:24761} seeing a bulge.  This bulge {ACTION; IS/IS ALP:37902409} bothersome.  Bowel Symptom: Bowel movements: *** time(s) per {Time; day/week/month:13537} Stool consistency: {stool consistency:24758} Straining: {yes/no:19897}.  Splinting: {yes/no:19897}.  Incomplete evacuation: {yes/no:19897}.  She {denies/ admits to:24761} accidental bowel leakage / fecal incontinence  Occurs: *** time(s) per {Time; day/week/month:13537}  Consistency with leakage: {stool consistency:24758} Bowel regimen: {bowel regimen:24759} Last colonoscopy:  Date ***, Results ***  Sexual Function Sexually active: {yes/no:19897}.  Sexual orientation: {Sexual [phone]} Pain with sex: {pain with sex:24762}  Pelvic Pain {denies/ admits to:24761} pelvic pain Location: *** Pain occurs: *** Prior pain treatment: *** Improved by: *** Worsened by: ***   Past Medical History:  Past Medical History:  Diagnosis Date   Anemia      Past Surgical History:  No past surgical history on file.   Past OB/GYN History: G{NUMBERS 1-10:18281} P{NUMBERS 1-10:18281} Vaginal deliveries: ***,  Forceps/ Vacuum deliveries: ***, Cesarean section: *** Menopausal: {menopausal:24763} Contraception: ***. Last pap smear was ***.  Any history of abnormal pap smears: {yes/no:19897}.   Medications: She has a current medication list which includes the following prescription(s): albuterol, azithromycin, cyclobenzaprine, ibuprofen, junel fe 1/20, medroxyprogesterone, metronidazole, multivitamin, and tramadol.   Allergies: Patient is allergic to penicillins.   Social History:  Social History   Tobacco Use   Smoking status: Never   Smokeless tobacco: Never  Substance Use Topics   Alcohol use: Never   Drug use: Never    Relationship status: {relationship status:24764} She lives with ***.   She {ACTION; IS/IS BDZ:32992426} employed ***. Regular exercise: {Yes/No:304960894} History of abuse: {Yes/No:304960894}  Family History:   Family History  Problem Relation Age of Onset   Healthy Mother    Healthy Father      Review of Systems: ROS   OBJECTIVE Physical Exam: There were no vitals filed for this visit.  Physical Exam   GU / Detailed Urogynecologic Evaluation:  Pelvic Exam: Normal external female genitalia; Bartholin's and Skene's glands normal in appearance; urethral meatus normal in appearance, no urethral masses or discharge.   CST: {gen negative/positive:315881}  Reflexes: bulbocavernosis {DESC; PRESENT/NOT  PRESENT:21021351}, anocutaneous {DESC; PRESENT/NOT PRESENT:21021351} ***bilaterally.  Speculum exam reveals normal vaginal mucosa {With/Without:20273} atrophy. Cervix {exam;  gyn cervix:30847}. Uterus {exam; pelvic uterus:30849}. Adnexa {exam; adnexa:12223}.    s/p hysterectomy: Speculum exam reveals normal vaginal mucosa {With/Without:20273}  atrophy and normal vaginal cuff.  Adnexa {exam; adnexa:12223}.    With apex supported, anterior compartment defect was {reduced:24765}  Pelvic floor strength {Roman # I-V:19040}/V, puborectalis {Roman # I-V:19040}/V external anal sphincter {Roman # I-V:19040}/V  Pelvic floor musculature: Right levator {Tender/Non-tender:20250}, Right obturator {Tender/Non-tender:20250}, Left levator {Tender/Non-tender:20250}, Left obturator {Tender/Non-tender:20250}  POP-Q:   POP-Q                                               Aa                                               Ba                                                 C                                                Gh                                               Pb                                               tvl                                                Ap                                               Bp                                                 D      Rectal Exam:  Normal sphincter tone, {rectocele:24766} distal rectocele, enterocoele {DESC; PRESENT/NOT PRESENT:21021351}, no rectal masses, {sign of:24767} dyssynergia when asking the patient to bear down.  Post-Void Residual (PVR) by Bladder Scan: In order to evaluate bladder emptying, we discussed obtaining a postvoid residual and she agreed to this procedure.  Procedure: The ultrasound unit was placed on the patient's abdomen in the suprapubic region after the patient had voided. A PVR of *** ml was obtained by bladder scan.  Laboratory Results: @ENCLABS @   ***  I visualized the urine specimen, noting the specimen to be  {urine color:24768}  ASSESSMENT AND PLAN [name] is a 31 y.o. with: No diagnosis found.    Marguerita Beards, MD   Medical Decision Making:  - Reviewed/ ordered a clinical laboratory test - Reviewed/ ordered a radiologic study - Reviewed/ ordered medicine test - Decision to obtain old records - Discussion of management of or test interpretation with an external physician / other healthcare professional  - Assessment requiring independent historian - Review and summation of prior records - Independent review of image, tracing or specimen

## 2022-08-06 ENCOUNTER — Ambulatory Visit: Payer: Medicaid Other | Admitting: Obstetrics and Gynecology

## 2022-09-06 ENCOUNTER — Encounter: Payer: Self-pay | Admitting: *Deleted

## 2022-12-18 ENCOUNTER — Emergency Department (HOSPITAL_COMMUNITY)
Admission: EM | Admit: 2022-12-18 | Discharge: 2022-12-18 | Disposition: A | Discharge status: Home / Self Care | Payer: Medicaid Other | Attending: Emergency Medicine | Admitting: Emergency Medicine

## 2022-12-18 ENCOUNTER — Encounter (HOSPITAL_COMMUNITY): Payer: Self-pay

## 2022-12-18 ENCOUNTER — Emergency Department (HOSPITAL_COMMUNITY): Payer: Medicaid Other

## 2022-12-18 ENCOUNTER — Other Ambulatory Visit: Payer: Self-pay

## 2022-12-18 DIAGNOSIS — R300 Dysuria: Secondary | ICD-10-CM | POA: Insufficient documentation

## 2022-12-18 DIAGNOSIS — R1030 Lower abdominal pain, unspecified: Secondary | ICD-10-CM | POA: Insufficient documentation

## 2022-12-18 DIAGNOSIS — M546 Pain in thoracic spine: Secondary | ICD-10-CM | POA: Diagnosis present

## 2022-12-18 LAB — CBC
HCT: 40.1 % (ref 36.0–46.0)
Hemoglobin: 13.2 g/dL (ref 12.0–15.0)
MCH: 31.9 pg (ref 26.0–34.0)
MCHC: 32.9 g/dL (ref 30.0–36.0)
MCV: 96.9 fL (ref 80.0–100.0)
Platelets: 192 10*3/uL (ref 150–400)
RBC: 4.14 MIL/uL (ref 3.87–5.11)
RDW: 11.9 % (ref 11.5–15.5)
WBC: 5.6 10*3/uL (ref 4.0–10.5)
nRBC: 0 % (ref 0.0–0.2)

## 2022-12-18 LAB — URINALYSIS, ROUTINE W REFLEX MICROSCOPIC
Bilirubin Urine: NEGATIVE
Glucose, UA: NEGATIVE mg/dL
Ketones, ur: NEGATIVE mg/dL
Nitrite: NEGATIVE
Protein, ur: NEGATIVE mg/dL
Specific Gravity, Urine: 1.011 (ref 1.005–1.030)
WBC, UA: >50 WBC/hpf (ref 0–5)
pH: 7 (ref 5.0–8.0)

## 2022-12-18 LAB — COMPREHENSIVE METABOLIC PANEL
ALT: 18 U/L (ref 0–44)
AST: 16 U/L (ref 15–41)
Albumin: 4 g/dL (ref 3.5–5.0)
Alkaline Phosphatase: 41 U/L (ref 38–126)
Anion gap: 5 (ref 5–15)
BUN: 8 mg/dL (ref 6–20)
CO2: 27 mmol/L (ref 22–32)
Calcium: 8.9 mg/dL (ref 8.9–10.3)
Chloride: 107 mmol/L (ref 98–111)
Creatinine, Ser: 0.72 mg/dL (ref 0.44–1.00)
GFR, Estimated: >60 mL/min (ref >60)
Glucose, Bld: 93 mg/dL (ref 70–99)
Potassium: 4.2 mmol/L (ref 3.5–5.1)
Sodium: 139 mmol/L (ref 135–145)
Total Bilirubin: 1 mg/dL (ref 0.3–1.2)
Total Protein: 6.2 g/dL — ABNORMAL LOW (ref 6.5–8.1)

## 2022-12-18 LAB — I-STAT BETA HCG BLOOD, ED (MC, WL, AP ONLY): I-stat hCG, quantitative: <5 m[IU]/mL (ref <5)

## 2022-12-18 LAB — LIPASE, BLOOD: Lipase: 33 U/L (ref 11–51)

## 2022-12-18 MED ORDER — IBUPROFEN 800 MG PO TABS
800.0000 mg | ORAL_TABLET | Freq: Once | ORAL | Status: AC
  Administered 2022-12-18: 800 mg via ORAL
  Filled 2022-12-18: qty 1

## 2022-12-18 MED ORDER — CYCLOBENZAPRINE HCL 10 MG PO TABS
10.0000 mg | ORAL_TABLET | Freq: Two times a day (BID) | ORAL | 0 refills | Status: DC | PRN
Start: 2022-12-18 — End: 2022-12-21

## 2022-12-18 NOTE — ED Provider Notes (Signed)
[place] EMERGENCY DEPARTMENT AT [facility] Provider Note   CSN: [id number] Arrival date & time: 12/18/22  1151     History  Chief Complaint  Patient presents with   Abdominal Pain    Barbara Hobbs is a 32 y.o. female history of chronic lower abdominal pain, bladder reflux presented with lower abdominal pain and back pain has been present for the past week.  Patient states she has been seen multiple times in the past for her lower abdominal pain with ultrasounds have not shown any abnormalities.  Patient states it burns when she pees however over the past week patient began noticing mid thoracic back pain that is worse with movement.  Patient denies any hematuria, fevers, nausea/vomiting, chest pain, shortness of breath.  Patient states she is still able to walk and has not noticed any new weakness.  Patient denied saddle anesthesia, urinary/bowel incontinence.  Patient denies any vaginal discharge, bleeding.  Patient states she has had dysuria over the past month but denies any hematuria.   Home Medications Prior to Admission medications   Medication Sig Start Date End Date Taking? Authorizing Provider  cyclobenzaprine (FLEXERIL) 10 MG tablet Take 1 tablet (10 mg total) by mouth 2 (two) times daily as needed for muscle spasms. 12/18/22  Yes Omero Kowal, Beverly Gust, PA-C  albuterol (PROVENTIL HFA;VENTOLIN HFA) 108 (90 BASE) MCG/ACT inhaler Inhale 2 puffs into the lungs 4 (four) times daily. Patient not taking: Reported on 01/13/2018 10/21/13   Reuben Likes, MD  azithromycin (ZITHROMAX Z-PAK) 250 MG tablet Take as directed. Patient not taking: Reported on 01/13/2018 10/21/13   Reuben Likes, MD  ibuprofen (ADVIL,MOTRIN) 800 MG tablet Take 1 tablet (800 mg total) by mouth every 8 (eight) hours as needed. Patient not taking: Reported on 01/13/2018 12/05/17   Maia Plan, MD  JUNEL FE 1/20 1-20 MG-MCG tablet Take 1 tablet by mouth daily. 09/23/17   [provider]   medroxyPROGESTERone (DEPO-PROVERA) 150 MG/ML injection Inject into the muscle. 06/20/19   [provider]  metroNIDAZOLE (FLAGYL) 500 MG tablet Take 1 tablet (500 mg total) by mouth 2 (two) times daily. 08/31/18   Hedges, Tinnie Gens, PA-C  Multiple Vitamin (MULTIVITAMIN) capsule Take 1 capsule by mouth daily. Gummies    [provider]  traMADol (ULTRAM) 50 MG tablet 1 to 2 tabs every 8 hours as needed for cough. Patient not taking: Reported on 01/13/2018 10/21/13   Reuben Likes, MD      Allergies    Penicillins    Review of Systems   Review of Systems  Gastrointestinal:  Positive for abdominal pain.    Physical Exam Updated Vital Signs BP 102/67 (BP Location: Right Arm)   Pulse 64   Temp 97.6 F (36.4 C)   Resp 15   Ht 5\' 6"  (1.676 m)   Wt 59 kg   LMP 12/06/2022   SpO2 98%   BMI 20.98 kg/m  Physical Exam Vitals reviewed.  Constitutional:      General: She is not in acute distress. HENT:     Head: Normocephalic and atraumatic.  Eyes:     Extraocular Movements: Extraocular movements intact.     Conjunctiva/sclera: Conjunctivae normal.     Pupils: Pupils are equal, round, and reactive to light.  Cardiovascular:     Rate and Rhythm: Normal rate and regular rhythm.     Pulses: Normal pulses.     Heart sounds: Normal heart sounds.     Comments: 2+ bilateral  radial/dorsalis pedis pulses with regular rate Pulmonary:     Effort: Pulmonary effort is normal. No respiratory distress.     Breath sounds: Normal breath sounds.  Abdominal:     Palpations: Abdomen is soft.     Tenderness: There is no abdominal tenderness. There is no right CVA tenderness, left CVA tenderness, guarding or rebound.  Musculoskeletal:        General: Normal range of motion.     Cervical back: Normal range of motion and neck supple.     Comments: 5 out of 5 bilateral grip/leg extension strength Mild tenderness to palpation in bilateral parathoracic muscles No midline tenderness or  abnormalities palpated  Skin:    General: Skin is warm and dry.     Capillary Refill: Capillary refill takes less than 2 seconds.  Neurological:     General: No focal deficit present.     Mental Status: She is alert and oriented to person, place, and time.     Sensory: Sensation is intact.     Motor: Motor function is intact.     Coordination: Coordination is intact.     Gait: Gait is intact.     Deep Tendon Reflexes:     Reflex Scores:      Patellar reflexes are 2+ on the right side and 2+ on the left side.    Comments: Sensation intact in all 4 limbs  Psychiatric:        Mood and Affect: Mood normal.     ED Results / Procedures / Treatments   Labs (all labs ordered are listed, but only abnormal results are displayed) Labs Reviewed  COMPREHENSIVE METABOLIC PANEL - Abnormal; Notable for the following components:      Result Value   Total Protein 6.2 (*)    All other components within normal limits  URINALYSIS, ROUTINE W REFLEX MICROSCOPIC - Abnormal; Notable for the following components:   APPearance HAZY (*)    Hgb urine dipstick SMALL (*)    Leukocytes,Ua LARGE (*)    Bacteria, UA RARE (*)    All other components within normal limits  LIPASE, BLOOD  CBC  I-STAT BETA HCG BLOOD, ED ([facility], [facility], [facility] ONLY)    EKG None  Radiology CT Renal Stone Study  Result Date: 12/18/2022 CLINICAL DATA:  Abdominal/flank pain. Clinical concern for ureteral stone. EXAM: CT ABDOMEN AND PELVIS WITHOUT CONTRAST TECHNIQUE: Multidetector CT imaging of the abdomen and pelvis was performed following the standard protocol without IV contrast. RADIATION DOSE REDUCTION: This exam was performed according to the departmental dose-optimization program which includes automated exposure control, adjustment of the mA and/or kV according to patient size and/or use of iterative reconstruction technique. COMPARISON:  None Available. FINDINGS: Lower chest: Clear lung bases. Hepatobiliary: No focal liver abnormality  is seen. No gallstones, gallbladder wall thickening, or biliary dilatation. Pancreas: Unremarkable. No pancreatic ductal dilatation or surrounding inflammatory changes. Spleen: Normal in size without focal abnormality. Adrenals/Urinary Tract: Adrenal glands are unremarkable. Kidneys are normal, without renal calculi, focal lesion, or hydronephrosis. Bladder is unremarkable. Stomach/Bowel: Stomach is within normal limits. Appendix appears normal. No evidence of bowel wall thickening, distention, or inflammatory changes. Vascular/Lymphatic: No significant vascular findings are present. No enlarged abdominal or pelvic lymph nodes. Reproductive: Uterus and bilateral adnexa are unremarkable. Other: No abdominal wall hernia or abnormality. No abdominopelvic ascites. Musculoskeletal: Normal. IMPRESSION: Normal unenhanced CT scan of the abdomen and pelvis. Electronically Signed   By: Amie Portland M.D.   On: 12/18/2022 14:50  Procedures Procedures    Medications Ordered in ED Medications  ibuprofen (ADVIL) tablet 800 mg (has no administration in time range)    ED Course/ Medical Decision Making/ A&P                             Medical Decision Making Amount and/or Complexity of Data Reviewed Labs: ordered. Radiology: ordered.   Barbara Hobbs 32 y.o. presented today for lower abdominal pain and back pain. Working DDx that I considered at this time includes, but not limited to, gastroenteritis, colitis, small bowel obstruction, appendicitis, cholecystitis, pancreatitis, nephrolithiasis, AAA, UTI, pyelonephritis, MSK, cauda equina, epidural abscess.  R/o DDx: gastroenteritis, colitis, small bowel obstruction, appendicitis, cholecystitis, pancreatitis, nephrolithiasis, AAA, UTI, pyelonephritis, cauda equina, epidural abscess: These are considered less likely due to history of present illness and physical exam findings.  Review of prior external notes: 03/11/2022 ED  Unique Tests and My  Interpretation:  CBC with differential: Unremarkable CMP: Unremarkable Lipase: Negative UA: Large leukocytes I-STAT beta-hCG: Negative CT Renal Study: No acute changes  Discussion with Independent Historian: None  Discussion of Management of Tests: None  Risk: Medium: prescription drug management  Risk Stratification Score: None  Plan: Patient presented for abdominal pain and back pain. On exam patient was in no acute distress and had stable vitals.  Patient physical exam was unremarkable as patient had tenderness to her parathoracic muscles most likely indicating MSK.  Patient reports having multiple ultrasounds in the past that have ultimately been unremarkable and so it also not ordered at this time as patient endorsing any vaginal symptoms and patient states she has had multiple of these in the past that have been inconclusive.  Patient's urine did show leukocytes and with patient's new onset back pain with her dysuria a CT renal stone study was ordered to evaluate for possible kidney stone/kidney infection as patient states her back pain is around where her kidneys are.  As patient states she does have history of bladder reflux.  Patient stable at this time.  Patient CT was negative.  At this time has show patient's back pain is MSK in nature as the rest of her labs are reassuring.  Patient will be given ibuprofen at her request at time of discharge and I strongly encouraged patient to follow-up with her primary care provider regarding recent symptoms and ER visit to be reevaluated symptoms may change.  Patient was given return precautions. Patient stable for discharge at this time.  Patient verbalized understanding of plan.         Final Clinical Impression(s) / ED Diagnoses Final diagnoses:  Acute bilateral thoracic back pain    Rx / DC Orders ED Discharge Orders          Ordered    cyclobenzaprine (FLEXERIL) 10 MG tablet  2 times daily PRN        12/18/22 1458               Netta Corrigan, PA-C 12/18/22 1459    Gwyneth Sprout, MD 12/18/22 2056

## 2022-12-18 NOTE — Discharge Instructions (Signed)
Please follow-up with your primary care provider regarding recent symptoms and ER visit.  Today your labs and imaging are all reassuring and your back pain is most likely musculoskeletal in nature.  You may take Tylenol or ibuprofen every 6 hours as needed for pain.  I have also prescribed Flexeril for you to take which is a muscle relaxer.  This medication is very sedating so please take it before bedtime and do not take it before operate machinery or driving.  If symptoms change or worsen please return to ER.

## 2022-12-18 NOTE — ED Triage Notes (Signed)
Pt came to ED for abd pain. Pt states she has always had groin pain. Pt states she has dysuria and back pain. Pt reports painful intercourse. Axox4. Hx of kidney reflux

## 2022-12-20 NOTE — Progress Notes (Unsigned)
[facility] New Patient Evaluation and Consultation  Referring Provider: Lezlie Hobbs, Barbara Hobbs, * PCP: Barbara Greenspan, MD Date of Service: 12/21/2022  SUBJECTIVE Chief Complaint: No chief complaint on file.  History of Present Illness: Barbara Hobbs is a 32 y.o. White or Caucasian female seen in consultation at the request of Dr. Lezlie Hobbs for evaluation of UUI and FI.    ***Review of records significant for: ***  Urinary Symptoms: {urine leakage?:24754} Leaks *** time(s) per {days/wks/mos/yrs:310907}.  Pad use: {NUMBERS 1-10:18281} {pad option:24752} per day.   She {ACTION; IS/IS WUJ:81191478} bothered by her UI symptoms.  Day time voids ***.  Nocturia: *** times per night to void. Voiding dysfunction: she {empties:24755} her bladder well.  {DOES NOT does:27190::"does not"} use a catheter to empty bladder.  When urinating, she feels {urine symptoms:24756} Drinks: *** per day  UTIs: {NUMBERS 1-10:18281} UTI's in the last year.   {ACTIONS;DENIES/REPORTS:21021675::"Denies"} history of {urologic concerns:24757}  Pelvic Organ Prolapse Symptoms:                  She {denies/ admits to:24761} a feeling of a bulge the vaginal area. It has been present for {NUMBER 1-10:22536} {days/wks/mos/yrs:310907}.  She {denies/ admits to:24761} seeing a bulge.  This bulge {ACTION; IS/IS GNF:62130865} bothersome.  Bowel Symptom: Bowel movements: *** time(s) per {Time; day/week/month:13537} Stool consistency: {stool consistency:24758} Straining: {yes/no:19897}.  Splinting: {yes/no:19897}.  Incomplete evacuation: {yes/no:19897}.  She {denies/ admits to:24761} accidental bowel leakage / fecal incontinence  Occurs: *** time(s) per {Time; day/week/month:13537}  Consistency with leakage: {stool consistency:24758} Bowel regimen: {bowel regimen:24759} Last colonoscopy: Date ***, Results ***  Sexual Function Sexually active: {yes/no:19897}.  Sexual orientation: {Sexual  [phone]} Pain with sex: {pain with sex:24762}  Pelvic Pain {denies/ admits to:24761} pelvic pain Location: *** Pain occurs: *** Prior pain treatment: *** Improved by: *** Worsened by: ***   Past Medical History:  Past Medical History:  Diagnosis Date   Anemia      Past Surgical History:  No past surgical history on file.   Past OB/GYN History: G{NUMBERS 1-10:18281} P{NUMBERS 1-10:18281} Vaginal deliveries: ***,  Forceps/ Vacuum deliveries: ***, Cesarean section: *** Menopausal: {menopausal:24763} Contraception: ***. Last pap smear was ***.  Any history of abnormal pap smears: {yes/no:19897}.   Medications: She has a current medication list which includes the following prescription(s): albuterol, azithromycin, cyclobenzaprine, ibuprofen, junel fe 1/20, medroxyprogesterone, metronidazole, multivitamin, and tramadol.   Allergies: Patient is allergic to penicillins.   Social History:  Social History   Tobacco Use   Smoking status: Never   Smokeless tobacco: Never  Substance Use Topics   Alcohol use: Never   Drug use: Never    Relationship status: {relationship status:24764} She lives with ***.   She {ACTION; IS/IS HQI:69629528} employed ***. Regular exercise: {Yes/No:304960894} History of abuse: {Yes/No:304960894}  Family History:   Family History  Problem Relation Age of Onset   Healthy Mother    Healthy Father      Review of Systems: ROS   OBJECTIVE Physical Exam: There were no vitals filed for this visit.  Physical Exam   GU / Detailed Urogynecologic Evaluation:  Pelvic Exam: Normal external female genitalia; Bartholin's and Skene's glands normal in appearance; urethral meatus normal in appearance, no urethral masses or discharge.   CST: {gen negative/positive:315881}  Reflexes: bulbocavernosis {DESC; PRESENT/NOT PRESENT:21021351}, anocutaneous {DESC; PRESENT/NOT PRESENT:21021351} ***bilaterally.  Speculum exam reveals normal  vaginal mucosa {With/Without:20273} atrophy. Cervix {exam; gyn cervix:30847}. Uterus {exam; pelvic uterus:30849}. Adnexa {exam; adnexa:12223}.    s/p hysterectomy: Speculum exam  reveals normal vaginal mucosa {With/Without:20273}  atrophy and normal vaginal cuff.  Adnexa {exam; adnexa:12223}.    With apex supported, anterior compartment defect was {reduced:24765}  Pelvic floor strength {Roman # I-V:19040}/V, puborectalis {Roman # I-V:19040}/V external anal sphincter {Roman # I-V:19040}/V  Pelvic floor musculature: Right levator {Tender/Non-tender:20250}, Right obturator {Tender/Non-tender:20250}, Left levator {Tender/Non-tender:20250}, Left obturator {Tender/Non-tender:20250}  POP-Q:   POP-Q                                               Aa                                               Ba                                                 C                                                Gh                                               Pb                                               tvl                                                Ap                                               Bp                                                 D      Rectal Exam:  Normal sphincter tone, {rectocele:24766} distal rectocele, enterocoele {DESC; PRESENT/NOT PRESENT:21021351}, no rectal masses, {sign of:24767} dyssynergia when asking the patient to bear down.  Post-Void Residual (PVR) by Bladder Scan: In order to evaluate bladder emptying, we discussed obtaining a postvoid residual and she agreed to this procedure.  Procedure: The ultrasound unit was placed on the patient's abdomen in the suprapubic region after the patient had voided. A PVR of *** ml was obtained by bladder scan.  Laboratory Results: @ENCLABS @   ***I visualized the urine specimen, noting the specimen to be {urine color:24768}  ASSESSMENT AND  PLAN Barbara Hobbs is a 32 y.o. with: No diagnosis found.    Barbara Dominion,  NP   Medical Decision Making:  - Reviewed/ ordered a clinical laboratory test - Reviewed/ ordered a radiologic study - Reviewed/ ordered medicine test - Decision to obtain old records - Discussion of management of or test interpretation with an external physician / other healthcare professional  - Assessment requiring independent historian - Review and summation of prior records - Independent review of image, tracing or specimen

## 2022-12-21 ENCOUNTER — Other Ambulatory Visit (HOSPITAL_COMMUNITY)
Admission: RE | Admit: 2022-12-21 | Discharge: 2022-12-21 | Disposition: A | Discharge status: Home / Self Care | Payer: Medicaid Other | Source: Ambulatory Visit | Attending: Obstetrics and Gynecology | Admitting: Obstetrics and Gynecology

## 2022-12-21 ENCOUNTER — Encounter: Payer: Self-pay | Admitting: Obstetrics and Gynecology

## 2022-12-21 ENCOUNTER — Ambulatory Visit: Payer: Medicaid Other | Admitting: Obstetrics and Gynecology

## 2022-12-21 VITALS — BP 103/67 | HR 60 | Ht 66.0 in | Wt 127.0 lb

## 2022-12-21 DIAGNOSIS — N898 Other specified noninflammatory disorders of vagina: Secondary | ICD-10-CM

## 2022-12-21 DIAGNOSIS — N393 Stress incontinence (female) (male): Secondary | ICD-10-CM

## 2022-12-21 DIAGNOSIS — R82998 Other abnormal findings in urine: Secondary | ICD-10-CM | POA: Diagnosis present

## 2022-12-21 DIAGNOSIS — R159 Full incontinence of feces: Secondary | ICD-10-CM

## 2022-12-21 DIAGNOSIS — M62838 Other muscle spasm: Secondary | ICD-10-CM | POA: Diagnosis not present

## 2022-12-21 DIAGNOSIS — R350 Frequency of micturition: Secondary | ICD-10-CM | POA: Insufficient documentation

## 2022-12-21 DIAGNOSIS — R152 Fecal urgency: Secondary | ICD-10-CM

## 2022-12-21 LAB — POCT URINALYSIS DIPSTICK
Bilirubin, UA: NEGATIVE
Blood, UA: NEGATIVE
Glucose, UA: NEGATIVE
Ketones, UA: NEGATIVE
Nitrite, UA: NEGATIVE
Protein, UA: NEGATIVE
Spec Grav, UA: 1.015 (ref 1.010–1.025)
Urobilinogen, UA: 0.2 E.U./dL
pH, UA: 6 (ref 5.0–8.0)

## 2022-12-21 MED ORDER — BACLOFEN 10 MG PO TABS: 10.0000 mg | ORAL_TABLET | Freq: Every evening | ORAL | 0 refills | Status: AC

## 2022-12-21 NOTE — Patient Instructions (Addendum)
Referral sent to pelvic floor PT.   Take baclofen at night for levator spasm.   Today we talked about ways to manage bladder urgency such as altering your diet to avoid irritative beverages and foods (bladder diet) as well as attempting to decrease stress and other exacerbating factors.  You can also chew a plain Tums 1-3 times per day to make your urine less acidic, especially if you have eating/drinking acidic things.   There is a website with helpful information for people with bladder irritation, called the IC Network at https://www.ic-network.com. This website has more information about a healthy bladder diet and patient forums for support.  The Most Bothersome Foods* The Least Bothersome Foods*  Coffee - Regular & Decaf Tea - caffeinated Carbonated beverages - cola, non-colas, diet & caffeine-free Alcohols - Beer, Red Wine, White Wine, [address] - Grapefruit, [place], Orange, [employer] - Cranberry, Grapefruit, Orange, Pineapple Vegetables - Tomato & Tomato Products Flavor Enhancers - Hot peppers, Spicy foods, Chili, Horseradish, Vinegar, Monosodium glutamate (MSG) Artificial Sweeteners - NutraSweet, Sweet 'N Low, Equal (sweetener), Saccharin Ethnic foods - [place], [place], [place] food [employer] - low-fat & whole Fruits - Bananas, Blueberries, Honeydew melon, Pears, Raisins, Watermelon Vegetables - Broccoli, [address] Sprouts, [place], Carrots, Cauliflower, [place], Cucumber, Mushrooms, Peas, Radishes, Squash, Zucchini, White potatoes, Sweet potatoes & yams Poultry - Chicken, Eggs, [place], [employer] - Beef, Diplomatic Services operational officer, Lamb Seafood - Shrimp, [place] fish, Salmon Grains - Oat, Rice Snacks - Pretzels, Popcorn  *Lenward Chancellor et al. Diet and its role in interstitial cystitis/bladder pain syndrome (IC/BPS) and comorbid conditions. [employer]. BJU Int. 2012 Jan 11.

## 2022-12-22 LAB — CERVICOVAGINAL ANCILLARY ONLY
Bacterial Vaginitis (gardnerella): NEGATIVE
Candida Glabrata: NEGATIVE
Candida Vaginitis: NEGATIVE
Comment: NEGATIVE
Comment: NEGATIVE
Comment: NEGATIVE

## 2022-12-23 LAB — URINE CULTURE: Culture: 40000 — AB

## 2022-12-23 MED ORDER — FOSFOMYCIN TROMETHAMINE 3 G PO PACK: 3.0000 g | PACK | Freq: Once | ORAL | 0 refills | Status: AC

## 2022-12-23 NOTE — Addendum Note (Signed)
Addended by: Selmer Dominion on: 12/23/2022 08:13 AM   Modules accepted: Orders

## 2022-12-23 NOTE — Progress Notes (Signed)
Pt was contacted and notified of her results

## 2023-02-03 ENCOUNTER — Ambulatory Visit: Payer: Medicaid Other | Attending: Obstetrics and Gynecology | Admitting: Physical Therapy

## 2023-02-03 NOTE — Therapy (Deleted)
OUTPATIENT PHYSICAL THERAPY FEMALE PELVIC EVALUATION   Patient Name: Barbara Hobbs MRN: [id number] DOB:[birth year], 32 y.o., female Today's Date: 02/03/2023  END OF SESSION:   Past Medical History:  Diagnosis Date   Anemia    Past Surgical History:  Procedure Laterality Date   KIDNEY SURGERY     TONSILLECTOMY     There are no problems to display for this patient.   PCP: ***  REFERRING PROVIDER: Selmer Dominion, NP   REFERRING DIAG:  R35.0 (ICD-10-CM) - Urinary frequency  [phone] (ICD-10-CM) - Levator spasm    THERAPY DIAG:  No diagnosis found.  Rationale for Evaluation and Treatment: Rehabilitation  ONSET DATE: ***  SUBJECTIVE:                                                                                                                                                                                           SUBJECTIVE STATEMENT: *** Fluid intake: {Yes/No:304960894}   PAIN:  Are you having pain? {yes/no:20286} NPRS scale: ***/10 Pain location: {pelvic pain location:27098}  Pain type: {type:313116} Pain description: {PAIN DESCRIPTION:21022940}   Aggravating factors: *** Relieving factors: ***  PRECAUTIONS: {Therapy precautions:24002}  RED FLAGS: {PT Red Flags:29287}   WEIGHT BEARING RESTRICTIONS: {Yes ***/No:24003}  FALLS:  Has patient fallen in last 6 months? {fallsyesno:27318}  LIVING ENVIRONMENT: Lives with: {[facility] lives with:25569::"lives with their family"} Lives in: {Lives in:25570} Stairs: {opstairs:27293} Has following equipment at home: {Assistive devices:23999}  OCCUPATION: ***  PLOF: {PLOF:24004}  PATIENT GOALS: ***  PERTINENT HISTORY:  *** Sexual abuse: {Yes/No:304960894}  BOWEL MOVEMENT: Pain with bowel movement: {yes/no:20286} Type of bowel movement:{PT BM type:27100} Fully empty rectum: {Yes/No:304960894} Leakage: {Yes/No:304960894} Pads: {Yes/No:304960894} Fiber supplement: {Yes/No:304960894}  URINATION: Pain  with urination: {yes/no:20286} Fully empty bladder: {Yes/No:304960894} Stream: {PT urination:27102} Urgency: {Yes/No:304960894} Frequency: *** Leakage: {PT leakage:27103} Pads: {Yes/No:304960894}  INTERCOURSE: Pain with intercourse: {pain with intercourse PA:27099} Ability to have vaginal penetration:  {Yes/No:304960894} Climax: *** Marinoff Scale: ***/3  PREGNANCY: Vaginal deliveries *** Tearing {Yes***/No:304960894} C-section deliveries *** Currently pregnant {Yes***/No:304960894}  PROLAPSE: {PT prolapse:27101}   OBJECTIVE:   DIAGNOSTIC FINDINGS:  ***  PATIENT SURVEYS:  {rehab surveys:24030}  PFIQ-7 ***  COGNITION: Overall cognitive status: {cognition:24006}     SENSATION: Light touch: {intact/deficits:24005} Proprioception: {intact/deficits:24005}  MUSCLE LENGTH: Hamstrings: Right *** deg; Left *** deg Thomas test: Right *** deg; Left *** deg  LUMBAR SPECIAL TESTS:  {lumbar special test:25242}  FUNCTIONAL TESTS:  {Functional tests:24029}  GAIT: Distance walked: *** Assistive device utilized: {Assistive devices:23999} Level of assistance: {Levels of assistance:24026} Comments: ***  POSTURE: {posture:25561}  PELVIC ALIGNMENT:  LUMBARAROM/PROM:  A/PROM A/PROM  eval  Flexion   Extension  Right lateral flexion   Left lateral flexion   Right rotation   Left rotation    (Blank rows = not tested)  LOWER EXTREMITY ROM:  {AROM/PROM:27142} ROM Right eval Left eval  Hip flexion    Hip extension    Hip abduction    Hip adduction    Hip internal rotation    Hip external rotation    Knee flexion    Knee extension    Ankle dorsiflexion    Ankle plantarflexion    Ankle inversion    Ankle eversion     (Blank rows = not tested)  LOWER EXTREMITY MMT:  MMT Right eval Left eval  Hip flexion    Hip extension    Hip abduction    Hip adduction    Hip internal rotation    Hip external rotation    Knee flexion    Knee extension     Ankle dorsiflexion    Ankle plantarflexion    Ankle inversion    Ankle eversion     PALPATION:   General  ***                External Perineal Exam ***                             Internal Pelvic Floor ***  Patient confirms identification and approves PT to assess internal pelvic floor and treatment {yes/no:20286}  PELVIC MMT:   MMT eval  Vaginal   Internal Anal Sphincter   External Anal Sphincter   Puborectalis   Diastasis Recti   (Blank rows = not tested)        TONE: ***  PROLAPSE: ***  TODAY'S TREATMENT:                                                                                                                              DATE: ***  EVAL ***   PATIENT EDUCATION:  Education details: *** Person educated: {Person educated:25204} Education method: {Education Method:25205} Education comprehension: {Education Comprehension:25206}  HOME EXERCISE PROGRAM: ***  ASSESSMENT:  CLINICAL IMPRESSION: Patient is a *** y.o. *** who was seen today for physical therapy evaluation and treatment for ***.   OBJECTIVE IMPAIRMENTS: {opptimpairments:25111}.   ACTIVITY LIMITATIONS: {activitylimitations:27494}  PARTICIPATION LIMITATIONS: {participationrestrictions:25113}  PERSONAL FACTORS: {Personal factors:25162} are also affecting patient's functional outcome.   REHAB POTENTIAL: {rehabpotential:25112}  CLINICAL DECISION MAKING: {clinical decision making:25114}  EVALUATION COMPLEXITY: {Evaluation complexity:25115}   GOALS: Goals reviewed with patient? {yes/no:20286}  SHORT TERM GOALS: Target date: ***  *** Baseline: Goal status: INITIAL  2.  *** Baseline:  Goal status: INITIAL  3.  *** Baseline:  Goal status: INITIAL  4.  *** Baseline:  Goal status: INITIAL  5.  *** Baseline:  Goal status: INITIAL  6.  *** Baseline:  Goal status: INITIAL  LONG TERM GOALS: Target date: ***  *** Baseline:  Goal status: INITIAL  2.  *** Baseline:   Goal status: INITIAL  3.  *** Baseline:  Goal status: INITIAL  4.  *** Baseline:  Goal status: INITIAL  5.  *** Baseline:  Goal status: INITIAL  6.  *** Baseline:  Goal status: INITIAL  PLAN:  PT FREQUENCY: {rehab frequency:25116}  PT DURATION: {rehab duration:25117}  PLANNED INTERVENTIONS: {rehab planned interventions:25118::"Therapeutic exercises","Therapeutic activity","Neuromuscular re-education","Balance training","Gait training","Patient/Family education","Self Care","Joint mobilization"}  PLAN FOR NEXT SESSION: ***   Brayton Caves , PT 02/03/2023, 8:23 AM

## 2023-02-21 ENCOUNTER — Ambulatory Visit: Payer: Medicaid Other | Admitting: Obstetrics and Gynecology

## 2023-03-15 ENCOUNTER — Ambulatory Visit: Payer: Medicaid Other | Admitting: Obstetrics and Gynecology

## 2023-03-25 ENCOUNTER — Ambulatory Visit: Payer: Medicaid Other | Admitting: Obstetrics and Gynecology
